# Patient Record
Sex: Male | Born: 1965 | Race: White | Hispanic: No | Smoking: Current every day smoker
Health system: Southern US, Community
[De-identification: ages and names within clinical notes are randomized; demographics above are authoritative.]

## PROBLEM LIST (undated history)

## (undated) VITALS — BP 136/86 | HR 68 | Temp 98.0°F | Resp 18 | Ht 68.0 in | Wt 183.0 lb

## (undated) DIAGNOSIS — S46009A Unspecified injury of muscle(s) and tendon(s) of the rotator cuff of unspecified shoulder, initial encounter: Secondary | ICD-10-CM

## (undated) DIAGNOSIS — F419 Anxiety disorder, unspecified: Secondary | ICD-10-CM

## (undated) DIAGNOSIS — E785 Hyperlipidemia, unspecified: Secondary | ICD-10-CM

## (undated) DIAGNOSIS — R55 Syncope and collapse: Secondary | ICD-10-CM

## (undated) DIAGNOSIS — I10 Essential (primary) hypertension: Secondary | ICD-10-CM

## (undated) HISTORY — PX: HERNIA REPAIR: SHX51

## (undated) HISTORY — PX: MULTIPLE TOOTH EXTRACTIONS: SHX2053

---

## 1998-05-08 ENCOUNTER — Inpatient Hospital Stay (HOSPITAL_COMMUNITY): Admission: AD | Admit: 1998-05-08 | Discharge: 1998-05-13 | Payer: Self-pay | Admitting: Psychiatry

## 1998-05-14 ENCOUNTER — Encounter (HOSPITAL_COMMUNITY): Admission: RE | Admit: 1998-05-14 | Discharge: 1998-08-12 | Payer: Self-pay | Admitting: Internal Medicine

## 1998-05-19 ENCOUNTER — Inpatient Hospital Stay (HOSPITAL_COMMUNITY): Admission: AD | Admit: 1998-05-19 | Discharge: 1998-05-21 | Payer: Self-pay | Admitting: Psychiatry

## 1998-09-11 ENCOUNTER — Inpatient Hospital Stay (HOSPITAL_COMMUNITY): Admission: EM | Admit: 1998-09-11 | Discharge: 1998-09-15 | Payer: Self-pay | Admitting: Emergency Medicine

## 1998-09-22 ENCOUNTER — Emergency Department (HOSPITAL_COMMUNITY): Admission: EM | Admit: 1998-09-22 | Discharge: 1998-09-22 | Payer: Self-pay | Admitting: Emergency Medicine

## 1999-04-01 ENCOUNTER — Emergency Department (HOSPITAL_COMMUNITY): Admission: EM | Admit: 1999-04-01 | Discharge: 1999-04-01 | Payer: Self-pay | Admitting: Emergency Medicine

## 1999-05-25 ENCOUNTER — Emergency Department (HOSPITAL_COMMUNITY): Admission: EM | Admit: 1999-05-25 | Discharge: 1999-05-25 | Payer: Self-pay | Admitting: Emergency Medicine

## 1999-06-25 ENCOUNTER — Emergency Department (HOSPITAL_COMMUNITY): Admission: EM | Admit: 1999-06-25 | Discharge: 1999-06-25 | Payer: Self-pay | Admitting: Emergency Medicine

## 1999-09-26 ENCOUNTER — Emergency Department (HOSPITAL_COMMUNITY): Admission: EM | Admit: 1999-09-26 | Discharge: 1999-09-26 | Payer: Self-pay | Admitting: Emergency Medicine

## 1999-11-09 ENCOUNTER — Emergency Department (HOSPITAL_COMMUNITY): Admission: EM | Admit: 1999-11-09 | Discharge: 1999-11-09 | Payer: Self-pay | Admitting: Emergency Medicine

## 1999-11-22 ENCOUNTER — Emergency Department (HOSPITAL_COMMUNITY): Admission: EM | Admit: 1999-11-22 | Discharge: 1999-11-22 | Payer: Self-pay | Admitting: Emergency Medicine

## 2001-03-20 ENCOUNTER — Encounter: Payer: Self-pay | Admitting: Emergency Medicine

## 2001-03-20 ENCOUNTER — Emergency Department (HOSPITAL_COMMUNITY): Admission: EM | Admit: 2001-03-20 | Discharge: 2001-03-20 | Payer: Self-pay | Admitting: Emergency Medicine

## 2001-05-22 ENCOUNTER — Emergency Department (HOSPITAL_COMMUNITY): Admission: EM | Admit: 2001-05-22 | Discharge: 2001-05-22 | Payer: Self-pay | Admitting: Emergency Medicine

## 2001-08-11 ENCOUNTER — Emergency Department (HOSPITAL_COMMUNITY): Admission: EM | Admit: 2001-08-11 | Discharge: 2001-08-11 | Payer: Self-pay | Admitting: Emergency Medicine

## 2001-08-11 ENCOUNTER — Encounter: Payer: Self-pay | Admitting: Emergency Medicine

## 2001-10-19 ENCOUNTER — Emergency Department (HOSPITAL_COMMUNITY): Admission: EM | Admit: 2001-10-19 | Discharge: 2001-10-19 | Payer: Self-pay | Admitting: Emergency Medicine

## 2002-12-27 ENCOUNTER — Encounter: Payer: Self-pay | Admitting: Family Medicine

## 2002-12-27 ENCOUNTER — Emergency Department (HOSPITAL_COMMUNITY): Admission: EM | Admit: 2002-12-27 | Discharge: 2002-12-27 | Payer: Self-pay | Admitting: Emergency Medicine

## 2003-01-28 ENCOUNTER — Emergency Department (HOSPITAL_COMMUNITY): Admission: EM | Admit: 2003-01-28 | Discharge: 2003-01-28 | Payer: Self-pay | Admitting: *Deleted

## 2003-03-12 ENCOUNTER — Emergency Department (HOSPITAL_COMMUNITY): Admission: EM | Admit: 2003-03-12 | Discharge: 2003-03-12 | Payer: Self-pay | Admitting: Emergency Medicine

## 2004-01-01 ENCOUNTER — Emergency Department (HOSPITAL_COMMUNITY): Admission: EM | Admit: 2004-01-01 | Discharge: 2004-01-01 | Payer: Self-pay | Admitting: Emergency Medicine

## 2004-01-23 ENCOUNTER — Emergency Department (HOSPITAL_COMMUNITY): Admission: EM | Admit: 2004-01-23 | Discharge: 2004-01-23 | Payer: Self-pay | Admitting: Emergency Medicine

## 2004-12-17 ENCOUNTER — Emergency Department (HOSPITAL_COMMUNITY): Admission: EM | Admit: 2004-12-17 | Discharge: 2004-12-18 | Payer: Self-pay | Admitting: *Deleted

## 2005-06-13 ENCOUNTER — Emergency Department (HOSPITAL_COMMUNITY): Admission: EM | Admit: 2005-06-13 | Discharge: 2005-06-13 | Payer: Self-pay | Admitting: Emergency Medicine

## 2006-05-27 ENCOUNTER — Ambulatory Visit (HOSPITAL_COMMUNITY): Admission: RE | Admit: 2006-05-27 | Discharge: 2006-05-27 | Payer: Self-pay | Admitting: Family Medicine

## 2006-05-27 ENCOUNTER — Ambulatory Visit: Payer: Self-pay | Admitting: Family Medicine

## 2006-12-08 DIAGNOSIS — F17200 Nicotine dependence, unspecified, uncomplicated: Secondary | ICD-10-CM | POA: Insufficient documentation

## 2006-12-08 DIAGNOSIS — M1990 Unspecified osteoarthritis, unspecified site: Secondary | ICD-10-CM | POA: Insufficient documentation

## 2010-06-09 ENCOUNTER — Emergency Department (HOSPITAL_COMMUNITY): Admission: EM | Admit: 2010-06-09 | Discharge: 2010-06-09 | Payer: Self-pay | Admitting: Emergency Medicine

## 2010-06-10 ENCOUNTER — Emergency Department (HOSPITAL_COMMUNITY): Admission: EM | Admit: 2010-06-10 | Discharge: 2010-06-10 | Payer: Self-pay | Admitting: Emergency Medicine

## 2011-09-22 ENCOUNTER — Encounter: Payer: Self-pay | Admitting: *Deleted

## 2011-09-22 ENCOUNTER — Emergency Department (HOSPITAL_COMMUNITY)
Admission: EM | Admit: 2011-09-22 | Discharge: 2011-09-23 | Discharge status: Against Medical Advice | Payer: Medicaid Other | Attending: Emergency Medicine | Admitting: Emergency Medicine

## 2011-09-22 DIAGNOSIS — R197 Diarrhea, unspecified: Secondary | ICD-10-CM | POA: Insufficient documentation

## 2011-09-22 DIAGNOSIS — R509 Fever, unspecified: Secondary | ICD-10-CM | POA: Insufficient documentation

## 2011-09-22 NOTE — ED Notes (Signed)
Cannot be found at this time

## 2011-09-22 NOTE — ED Notes (Signed)
Pt reports fever, diarrhea x 3 days. Denies any vomiting or bodyaches. Mask on pt at triage.

## 2012-02-12 ENCOUNTER — Encounter (HOSPITAL_COMMUNITY): Payer: Self-pay

## 2012-02-12 ENCOUNTER — Emergency Department (HOSPITAL_COMMUNITY)
Admission: EM | Admit: 2012-02-12 | Discharge: 2012-02-12 | Disposition: A | Discharge status: Home / Self Care | Payer: Medicaid Other | Attending: Emergency Medicine | Admitting: Emergency Medicine

## 2012-02-12 ENCOUNTER — Emergency Department (HOSPITAL_COMMUNITY): Payer: Medicaid Other

## 2012-02-12 DIAGNOSIS — S4350XA Sprain of unspecified acromioclavicular joint, initial encounter: Secondary | ICD-10-CM | POA: Insufficient documentation

## 2012-02-12 DIAGNOSIS — IMO0001 Reserved for inherently not codable concepts without codable children: Secondary | ICD-10-CM

## 2012-02-12 DIAGNOSIS — X58XXXA Exposure to other specified factors, initial encounter: Secondary | ICD-10-CM | POA: Insufficient documentation

## 2012-02-12 DIAGNOSIS — Z79899 Other long term (current) drug therapy: Secondary | ICD-10-CM | POA: Insufficient documentation

## 2012-02-12 DIAGNOSIS — M25519 Pain in unspecified shoulder: Secondary | ICD-10-CM | POA: Insufficient documentation

## 2012-02-12 MED ORDER — HYDROCODONE-ACETAMINOPHEN 5-325 MG PO TABS: 1.0000 | ORAL_TABLET | Freq: Four times a day (QID) | ORAL | Status: AC | PRN

## 2012-02-12 NOTE — ED Provider Notes (Signed)
Medical screening examination/treatment/procedure(s) were performed by non-physician practitioner and as supervising physician I was immediately available for consultation/collaboration.   Joya Gaskins, MD 02/12/12 [phone]

## 2012-02-12 NOTE — ED Notes (Signed)
Pt reports Right shoulder pain x2 months, pain worse last night.  Has been on meds given by pmd.

## 2012-02-12 NOTE — ED Provider Notes (Signed)
History     CSN: [id number]  Arrival date & time 02/12/12  1104   First MD Initiated Contact with Patient 02/12/12 1121      Chief Complaint  Patient presents with  . Shoulder Pain    (Consider location/radiation/quality/duration/timing/severity/associated sxs/prior treatment) Patient is a 46 y.o. male presenting with shoulder pain. The history is provided by the patient. No language interpreter was used.  Shoulder Pain This is a chronic problem. Episode onset: over 2 months ago. The problem occurs constantly. The problem has been unchanged. Pertinent negatives include no numbness or weakness. Exacerbated by: moving arm. Treatments tried: saw his PCP in [place] who put him an arthritis med ( ? NSAID) with no improvement.  no known injury. The treatment provided no relief.    History reviewed. No pertinent past medical history.  Past Surgical History  Procedure Date  . Hernia repair     No family history on file.  History  Substance Use Topics  . Smoking status: Current Everyday Smoker -- 1.0 packs/day    Types: Cigarettes  . Smokeless tobacco: Not on file  . Alcohol Use: No      Review of Systems  Musculoskeletal:       R AC pain.  Neurological: Negative for weakness and numbness.  All other systems reviewed and are negative.    Allergies  Review of patient's allergies indicates no known allergies.  Home Medications   Current Outpatient Rx  Name Route Sig Dispense Refill  . HYDROCODONE-ACETAMINOPHEN 5-325 MG PO TABS Oral Take 1 tablet by mouth every 6 (six) hours as needed for pain. 20 tablet 0  . PSEUDOEPHEDRINE-APAP-DM 60-1000-30 MG/30ML PO LIQD Oral Take 10 mLs by mouth at bedtime as needed. For cold symptoms       BP 127/66  Pulse 80  Temp(Src) 98 F (36.7 C) (Oral)  Resp 20  Ht 5' 9.5" (1.765 m)  Wt 180 lb (81.647 kg)  BMI 26.20 kg/m2  SpO2 100%  Physical Exam  Nursing note and vitals reviewed. Constitutional: He is oriented to person, place, and  time. He appears well-developed and well-nourished.  HENT:  Head: Normocephalic and atraumatic.  Eyes: EOM are normal.  Neck: Normal range of motion.  Cardiovascular: Normal rate, regular rhythm, normal heart sounds and intact distal pulses.   Pulmonary/Chest: Effort normal and breath sounds normal. No respiratory distress.  Abdominal: Soft. He exhibits no distension. There is no tenderness.  Musculoskeletal: He exhibits tenderness.       Right shoulder: He exhibits decreased range of motion, tenderness, bony tenderness and pain. He exhibits no swelling, no effusion, no crepitus, no deformity and normal pulse.       Pain localized to R AC joint.  Worse with active ROM and with passive distraction downwardly and with placement of R hand on L shoulder.  Neurological: He is alert and oriented to person, place, and time.  Skin: Skin is warm and dry.  Psychiatric: He has a normal mood and affect. Judgment normal.    ED Course  Procedures (including critical care time)  Labs Reviewed - No data to display Dg Ac Joints  02/12/2012  *RADIOLOGY REPORT*  Clinical Data: Right shoulder pain  LEFT ACROMIOCLAVICULAR JOINTS  Comparison: None.  Findings: The AC joints are symmetric and intact bilaterally.  No osseous lesions or fractures are identified.  IMPRESSION: There is no evidence of AC joint dislocation.  Original Report Authenticated By: Brandon Melnick, M.D.     1. Acromioclavicular (joint) (ligament)  sprain and strain       MDM  Exam c/w AC strain.  X-rays are normal.  Sling, ice and f/u with dr. Hilda Lias or Romeo Apple.        Worthy Rancher, PA 02/12/12 1255

## 2012-02-12 NOTE — ED Notes (Signed)
Pt presents with rt shoulder pain that has progressively gotten worse over the past 2 months. Pt reports seeing PCP. Was placed on an " arthritis medicine" per pt without relief. Pt denies trauma/injury. No deformity noted.

## 2012-07-31 ENCOUNTER — Emergency Department (HOSPITAL_COMMUNITY)
Admission: EM | Admit: 2012-07-31 | Discharge: 2012-07-31 | Disposition: A | Discharge status: Home / Self Care | Payer: Medicaid Other | Attending: Emergency Medicine | Admitting: Emergency Medicine

## 2012-07-31 ENCOUNTER — Emergency Department (HOSPITAL_COMMUNITY): Payer: Medicaid Other

## 2012-07-31 ENCOUNTER — Encounter (HOSPITAL_COMMUNITY): Payer: Self-pay | Admitting: Emergency Medicine

## 2012-07-31 DIAGNOSIS — J069 Acute upper respiratory infection, unspecified: Secondary | ICD-10-CM | POA: Insufficient documentation

## 2012-07-31 DIAGNOSIS — R059 Cough, unspecified: Secondary | ICD-10-CM | POA: Insufficient documentation

## 2012-07-31 DIAGNOSIS — F17200 Nicotine dependence, unspecified, uncomplicated: Secondary | ICD-10-CM | POA: Insufficient documentation

## 2012-07-31 DIAGNOSIS — R05 Cough: Secondary | ICD-10-CM | POA: Insufficient documentation

## 2012-07-31 DIAGNOSIS — B9789 Other viral agents as the cause of diseases classified elsewhere: Secondary | ICD-10-CM | POA: Insufficient documentation

## 2012-07-31 DIAGNOSIS — B349 Viral infection, unspecified: Secondary | ICD-10-CM

## 2012-07-31 MED ORDER — ALBUTEROL SULFATE HFA 108 (90 BASE) MCG/ACT IN AERS
2.0000 | INHALATION_SPRAY | Freq: Four times a day (QID) | RESPIRATORY_TRACT | Status: DC | PRN
  Administered 2012-07-31: 2 via RESPIRATORY_TRACT
  Filled 2012-07-31: qty 6.7

## 2012-07-31 NOTE — ED Notes (Signed)
Pt c/o cough/congestion x 5 days. Pt stated the nasal congestion has moved to his chest and he has coughing fits that makes it difficult to breathe. Pt stated he has been coughing up thick,clear mucus.

## 2012-07-31 NOTE — ED Provider Notes (Signed)
History    This chart was scribed for Ward Givens, MD, MD by Smitty Pluck. The patient was seen in room APA11 and the patient's care was started at 9:26AM.   CSN: [id number]  Arrival date & time 07/31/12  5366      Chief Complaint  Patient presents with  . Cough  . Nasal Congestion    (Consider location/radiation/quality/duration/timing/severity/associated sxs/prior treatment) Patient is a 46 y.o. male presenting with cough. The history is provided by the patient. No language interpreter was used.  Cough Associated symptoms include rhinorrhea. Pertinent negatives include no chills.   Aaron [name] is a 46 y.o. male who presents to the Emergency Department complaining of constant, moderate productive cough with clear sputum onset 5 days ago. Pt reports taking Dayquil and Nyquil with improvement. He states he has congestion with clear sputum, sneezing and clear rhinorrhea. He reports that he has SOB during episodes of coughing. He states that he has wheezing with cough, and states he has had to use an inhaler in the past with colds. He denies having fever.  States that he smokes. He has had sick contact with wife (cough).   PCP Bennie Pierini  [facility] in [place]  History reviewed. No pertinent past medical history.  Past Surgical History  Procedure Date  . Hernia repair     History reviewed. No pertinent family history.  History  Substance Use Topics  . Smoking status: Current Every Day Smoker -- 1.0 packs/day    Types: Cigarettes  . Smokeless tobacco: Not on file  . Alcohol Use: No   Employed Lives with spouse   Review of Systems  Constitutional: Negative for fever and chills.  HENT: Positive for congestion, rhinorrhea and sneezing.   Respiratory: Positive for cough.   All other systems reviewed and are negative.    Allergies  Review of patient's allergies indicates no known allergies.  Home Medications   Current Outpatient Rx  Name Route  Sig Dispense Refill  . PSEUDOEPHEDRINE-APAP-DM 60-1000-30 MG/30ML PO LIQD Oral Take 10 mLs by mouth at bedtime as needed. For cold symptoms       BP 127/64  Pulse 82  Temp 98.7 F (37.1 C) (Oral)  Resp 17  Ht 5\' 10"  (1.778 m)  Wt 187 lb (84.823 kg)  BMI 26.83 kg/m2  SpO2 97%  Vital signs normal   Physical Exam  Nursing note and vitals reviewed. Constitutional: He is oriented to person, place, and time. He appears well-developed and well-nourished.  Non-toxic appearance. He does not appear ill. No distress.  HENT:  Head: Normocephalic and atraumatic.  Right Ear: External ear normal.  Left Ear: External ear normal.  Nose: Nose normal. No mucosal edema or rhinorrhea.  Mouth/Throat: Oropharynx is clear and moist and mucous membranes are normal. No dental abscesses or uvula swelling.  Eyes: Conjunctivae normal and EOM are normal. Pupils are equal, round, and reactive to light.  Neck: Normal range of motion and full passive range of motion without pain. Neck supple.  Cardiovascular: Normal rate, regular rhythm and normal heart sounds.  Exam reveals no gallop and no friction rub.   No murmur heard. Pulmonary/Chest: Effort normal. No respiratory distress. He has no rhonchi. He has no rales. He exhibits no tenderness and no crepitus.       Faint expiratory wheeze that cleared with coughing   Abdominal: Soft. Normal appearance and bowel sounds are normal. He exhibits no distension. There is no tenderness. There is no rebound  and no guarding.  Musculoskeletal: Normal range of motion. He exhibits no edema and no tenderness.       Moves all extremities well.   Neurological: He is alert and oriented to person, place, and time. He has normal strength. No cranial nerve deficit.  Skin: Skin is warm, dry and intact. No rash noted. No erythema. No pallor.  Psychiatric: He has a normal mood and affect. His speech is normal and behavior is normal. His mood appears not anxious.    ED Course    Procedures (including critical care time) DIAGNOSTIC STUDIES: Oxygen Saturation is 97% on room air, normal by my interpretation.    COORDINATION OF CARE: 9:48 AM Discussed ED treatment with pt      Dg Chest 2 View  07/31/2012  *RADIOLOGY REPORT*  Clinical Data: Cough, congestion, smoker  CHEST - 2 VIEW  Comparison: None.  Findings: Normal heart size, mediastinal contours, and pulmonary vascularity. Minimal bronchitic changes. No pulmonary infiltrate, pleural effusion, or pneumothorax. Clothing artifacts. Bones unremarkable.  IMPRESSION: Minimal bronchitic changes.   Original Report Authenticated By: Lollie Marrow, M.D.      1. Upper respiratory infection   2. Viral illness    Plan discharge  Devoria Albe, MD, FACEP    MDM  I personally performed the services described in this documentation, which was scribed in my presence. The recorded information has been reviewed and considered.  Devoria Albe, MD, Armando Gang    Ward Givens, MD 07/31/12 [phone]

## 2012-12-31 ENCOUNTER — Ambulatory Visit: Payer: Managed Care, Other (non HMO)

## 2013-02-18 ENCOUNTER — Inpatient Hospital Stay (HOSPITAL_COMMUNITY)
Admission: AD | Admit: 2013-02-18 | Discharge: 2013-02-26 | DRG: 885 | Disposition: A | Discharge status: Home / Self Care | Payer: 59 | Attending: Psychiatry | Admitting: Psychiatry

## 2013-02-18 ENCOUNTER — Encounter (HOSPITAL_COMMUNITY): Payer: Self-pay | Admitting: Registered Nurse

## 2013-02-18 DIAGNOSIS — F339 Major depressive disorder, recurrent, unspecified: Secondary | ICD-10-CM | POA: Diagnosis present

## 2013-02-18 DIAGNOSIS — F332 Major depressive disorder, recurrent severe without psychotic features: Principal | ICD-10-CM | POA: Diagnosis present

## 2013-02-18 DIAGNOSIS — S46009A Unspecified injury of muscle(s) and tendon(s) of the rotator cuff of unspecified shoulder, initial encounter: Secondary | ICD-10-CM

## 2013-02-18 DIAGNOSIS — M1990 Unspecified osteoarthritis, unspecified site: Secondary | ICD-10-CM

## 2013-02-18 DIAGNOSIS — F4310 Post-traumatic stress disorder, unspecified: Secondary | ICD-10-CM | POA: Diagnosis present

## 2013-02-18 DIAGNOSIS — R45851 Suicidal ideations: Secondary | ICD-10-CM

## 2013-02-18 DIAGNOSIS — Z79899 Other long term (current) drug therapy: Secondary | ICD-10-CM

## 2013-02-18 DIAGNOSIS — F411 Generalized anxiety disorder: Secondary | ICD-10-CM | POA: Diagnosis present

## 2013-02-18 DIAGNOSIS — F1010 Alcohol abuse, uncomplicated: Secondary | ICD-10-CM | POA: Diagnosis present

## 2013-02-18 DIAGNOSIS — F17200 Nicotine dependence, unspecified, uncomplicated: Secondary | ICD-10-CM

## 2013-02-18 HISTORY — DX: Unspecified injury of muscle(s) and tendon(s) of the rotator cuff of unspecified shoulder, initial encounter: S46.009A

## 2013-02-18 MED ORDER — NICOTINE 21 MG/24HR TD PT24
21.0000 mg | MEDICATED_PATCH | Freq: Every day | TRANSDERMAL | Status: DC
  Administered 2013-02-18 – 2013-02-26 (×9): 21 mg via TRANSDERMAL
  Filled 2013-02-18 (×13): qty 1

## 2013-02-18 MED ORDER — MAGNESIUM HYDROXIDE 400 MG/5ML PO SUSP: 30.0000 mL | Freq: Every day | ORAL | Status: DC | PRN

## 2013-02-18 MED ORDER — TRAZODONE HCL 50 MG PO TABS
50.0000 mg | ORAL_TABLET | Freq: Every evening | ORAL | Status: DC | PRN
  Administered 2013-02-18: 50 mg via ORAL
  Filled 2013-02-18: qty 1

## 2013-02-18 MED ORDER — ALUM & MAG HYDROXIDE-SIMETH 200-200-20 MG/5ML PO SUSP: 30.0000 mL | ORAL | Status: DC | PRN

## 2013-02-18 MED ORDER — HYDROXYZINE HCL 25 MG PO TABS
25.0000 mg | ORAL_TABLET | Freq: Four times a day (QID) | ORAL | Status: DC | PRN
  Administered 2013-02-18 – 2013-02-26 (×15): 25 mg via ORAL
  Filled 2013-02-18: qty 1
  Filled 2013-02-18: qty 30

## 2013-02-18 MED ORDER — LORAZEPAM 1 MG PO TABS: 1.0000 mg | ORAL_TABLET | Freq: Once | ORAL | Status: AC | PRN

## 2013-02-18 MED ORDER — ACETAMINOPHEN 325 MG PO TABS
650.0000 mg | ORAL_TABLET | Freq: Four times a day (QID) | ORAL | Status: DC | PRN
  Administered 2013-02-25: 650 mg via ORAL
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
## 2013-02-18 NOTE — Tx Team (Signed)
Initial Interdisciplinary Treatment Plan  PATIENT STRENGTHS: (choose at least two) Ability for insight Active sense of humor Average or above average intelligence Capable of independent living Communication skills General fund of knowledge  PATIENT STRESSORS: Loss of spouse Medication change or noncompliance   PROBLEM LIST: Problem List/Patient Goals Date to be addressed Date deferred Reason deferred Estimated date of resolution  Depression                                                       DISCHARGE CRITERIA:  Ability to meet basic life and health needs Improved stabilization in mood, thinking, and/or behavior Motivation to continue treatment in a less acute level of care Need for constant or close observation no longer present Reduction of life-threatening or endangering symptoms to within safe limits Verbal commitment to aftercare and medication compliance  PRELIMINARY DISCHARGE PLAN: Attend aftercare/continuing care group Outpatient therapy Return to previous living arrangement  PATIENT/FAMIILY INVOLVEMENT: This treatment plan has been presented to and reviewed with the patient, Aaron [name], and/or family member, .  The patient and family have been given the opportunity to ask questions and make suggestions.  Andrena Mews 02/18/2013, 5:34 PM
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
## 2013-02-19 ENCOUNTER — Encounter (HOSPITAL_COMMUNITY): Payer: Self-pay | Admitting: Psychiatry

## 2013-02-19 DIAGNOSIS — F332 Major depressive disorder, recurrent severe without psychotic features: Principal | ICD-10-CM

## 2013-02-19 DIAGNOSIS — F1010 Alcohol abuse, uncomplicated: Secondary | ICD-10-CM

## 2013-02-19 DIAGNOSIS — F4310 Post-traumatic stress disorder, unspecified: Secondary | ICD-10-CM | POA: Diagnosis present

## 2013-02-19 DIAGNOSIS — F339 Major depressive disorder, recurrent, unspecified: Secondary | ICD-10-CM | POA: Diagnosis present

## 2013-02-19 LAB — CBC
HCT: 42.9 % (ref 39.0–52.0)
Hemoglobin: 14.9 g/dL (ref 13.0–17.0)
MCHC: 34.7 g/dL (ref 30.0–36.0)
MCV: 92.5 fL (ref 78.0–100.0)
RDW: 12.4 % (ref 11.5–15.5)

## 2013-02-19 LAB — COMPREHENSIVE METABOLIC PANEL
Albumin: 3.8 g/dL (ref 3.5–5.2)
Alkaline Phosphatase: 41 U/L (ref 39–117)
BUN: 8 mg/dL (ref 6–23)
Creatinine, Ser: 0.94 mg/dL (ref 0.50–1.35)
GFR calc Af Amer: >90 mL/min (ref >90)
Glucose, Bld: 95 mg/dL (ref 70–99)
Total Bilirubin: 0.5 mg/dL (ref 0.3–1.2)
Total Protein: 7.2 g/dL (ref 6.0–8.3)

## 2013-02-19 LAB — URINALYSIS, ROUTINE W REFLEX MICROSCOPIC
Hgb urine dipstick: NEGATIVE
Ketones, ur: NEGATIVE mg/dL
Nitrite: NEGATIVE
Protein, ur: NEGATIVE mg/dL
Specific Gravity, Urine: 1.016 (ref 1.005–1.030)

## 2013-02-19 LAB — LIPID PANEL
HDL: 52 mg/dL (ref >39)
LDL Cholesterol: 137 mg/dL — ABNORMAL HIGH (ref 0–99)
Triglycerides: 170 mg/dL — ABNORMAL HIGH (ref <150)

## 2013-02-19 LAB — HEMOGLOBIN A1C
Hgb A1c MFr Bld: 5.2 % (ref <5.7)
Mean Plasma Glucose: 103 mg/dL (ref <117)

## 2013-02-19 LAB — MAGNESIUM: Magnesium: 2.3 mg/dL (ref 1.5–2.5)

## 2013-02-19 LAB — RAPID URINE DRUG SCREEN, HOSP PERFORMED
Barbiturates: NOT DETECTED
Cocaine: NOT DETECTED

## 2013-02-19 LAB — BILIRUBIN, DIRECT: Bilirubin, Direct: <0.1 mg/dL (ref 0.0–0.3)

## 2013-02-19 MED ORDER — GABAPENTIN 100 MG PO CAPS
200.0000 mg | ORAL_CAPSULE | Freq: Three times a day (TID) | ORAL | Status: DC
  Administered 2013-02-19 – 2013-02-21 (×7): 200 mg via ORAL
  Filled 2013-02-19 (×14): qty 2

## 2013-02-19 MED ORDER — ENSURE COMPLETE PO LIQD: 237.0000 mL | Freq: Two times a day (BID) | ORAL | Status: DC | PRN

## 2013-02-19 MED ORDER — ADULT MULTIVITAMIN W/MINERALS CH
1.0000 | ORAL_TABLET | Freq: Every day | ORAL | Status: DC
  Administered 2013-02-19 – 2013-02-26 (×8): 1 via ORAL
  Filled 2013-02-19 (×10): qty 1

## 2013-02-19 MED ORDER — DULOXETINE HCL 30 MG PO CPEP
30.0000 mg | ORAL_CAPSULE | Freq: Every day | ORAL | Status: DC
  Administered 2013-02-19 – 2013-02-21 (×3): 30 mg via ORAL
  Filled 2013-02-19 (×6): qty 1

## 2013-02-19 MED ORDER — TRAZODONE HCL 50 MG PO TABS
150.0000 mg | ORAL_TABLET | Freq: Every evening | ORAL | Status: DC | PRN
  Administered 2013-02-19 – 2013-02-25 (×7): 150 mg via ORAL
  Filled 2013-02-19 (×8): qty 1
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
## 2013-02-19 NOTE — BHH Group Notes (Signed)
[facility] LCSW Aftercare Discharge Planning Group Note   02/19/2013 8:45 AM   Participation Quality: Alert and Appropriate   Mood/Affect: Flat and Depressed  Depression Rating: 9  Anxiety Rating: 6  Thoughts of Suicide: Yes  Will you contract for safety? Yes  Current AVH: No   Plan for Discharge/Comments: Pt attended discharge planning group and actively participated in group. CSW provided pt with today's workbook. Pt states that he came here for depression.  Pt states that he plans to return home, work and take care of his kids.  Pt states that he lives in [place].  No aftercare follow up plans at this time.  Pt states that he doesn't have a psychiatrist/therapist at this time but is open to referrals.  No further needs voiced by pt at this time.    Transportation Means: Pt reports access to transportation   Supports: None reported.   Aaron [name], LCSWA  02/19/2013  9:20 AM

## 2013-02-19 NOTE — Progress Notes (Signed)
D.  Took over Pt's care at 2330.  Pt resting in bed with eyes closed, respirations even and unlabored.  No distress noted.  New admission to unit, see admit note.  A.  Will continue to monitor.  R.  No distress noted, Pt remains safe on unit.

## 2013-02-19 NOTE — Tx Team (Signed)
Interdisciplinary Treatment Plan Update (Adult)  Date: 02/19/2013  Time Reviewed:  9:45 AM  Progress in Treatment: Attending groups: Yes Participating in groups:  Yes Taking medication as prescribed:  Yes Tolerating medication:  Yes Family/Significant othe contact made: No Patient understands diagnosis:  Yes Discussing patient identified problems/goals with staff:  Yes Medical problems stabilized or resolved:  Yes Denies suicidal/homicidal ideation: Yes Issues/concerns per patient self-inventory:  Yes Other:  New problem(s) identified: N/A  Discharge Plan or Barriers: CSW assessing for appropriate follow up for pt.   Reason for Continuation of Hospitalization: Anxiety Depression Medication Stabilization  Comments: N/A  Estimated length of stay: 3-5 days  For review of initial/current patient goals, please see plan of care.  Attendees: Patient:     Family:     Physician:  Dr. Dub Mikes 02/19/2013 1:56 PM   Nursing:   Liborio Nixon, RN 02/19/2013 1:56 PM   Clinical Social Worker:  Reyes Ivan, LCSWA 02/19/2013 1:56 PM   Other: Robbie Louis, RN 02/19/2013 1:57 PM   Other:     Other:     Other:     Other:    Other:    Other:    Other:    Other:    Other:     Scribe for Treatment Team:   Carmina Miller, 02/19/2013 1:57 PM
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
## 2013-02-19 NOTE — Progress Notes (Signed)
Adult Psychoeducational Group Note  Date:  02/19/2013 Time:  11:48 AM  Group Topic/Focus:  Self Care:   The focus of this group is to help patients understand the importance of self-care in order to improve or restore emotional, physical, spiritual, interpersonal, and financial health.  Participation Level:  Did Not Attend   Additional Comments:  Pt. Did not attend group   Aaron [name] 02/19/2013, 11:48 AM 

## 2013-02-19 NOTE — BHH Suicide Risk Assessment (Signed)
Suicide Risk Assessment  Admission Assessment     Nursing information obtained from:    Demographic factors:    Current Mental Status:    Loss Factors:    Historical Factors:    Risk Reduction Factors:     CLINICAL FACTORS:   Depression:   Comorbid alcohol abuse/dependence Insomnia Severe Alcohol/Substance Abuse/Dependencies Previous Psychiatric Diagnoses and Treatments  COGNITIVE FEATURES THAT CONTRIBUTE TO RISK: No evidence   SUICIDE RISK:   Moderate:  Frequent suicidal ideation with limited intensity, and duration, some specificity in terms of plans, no associated intent, good self-control, limited dysphoria/symptomatology, some risk factors present, and identifiable protective factors, including available and accessible social support.  PLAN OF CARE: Supportive approach/coping skills/relapse prevention                              Will start Cymbalta/Neurontin  I certify that inpatient services furnished can reasonably be expected to improve the patient's condition.  Dunya Meiners A 02/19/2013, 3:43 PM

## 2013-02-19 NOTE — Progress Notes (Signed)
Pt reports he is doing better this evening.  He denies any withdrawal symptoms at this time.  He contracts for safety.  He denies HI/AV.  Spent some time talking with pt about his losses in such a short time. Pt is programming on the 500 hall.  Pt voices no needs or concern at this time.  Support and encouragement offered.  Pt plans to return home at discharge.  Safety maintained with q15 minute checks.

## 2013-02-19 NOTE — Progress Notes (Signed)
Recreation Therapy Notes  Date: 05.12.2014 Time: 3:00pm Location: 500 Hall Day Room      Group Topic/Focus: Communication  Participation Level: Active  Participation Quality: Appropriate and Sharing  Affect: Euthymic  Cognitive: Appropriate     Additional Comments: Activity: The Question Web; Explanation: Patients were asked to pass a spool of string around the circle. As each patient passed the spool they stated a number 1 - 20. The person they passed the spool to were then asked to answer a question read by LRT.   Patient participated in activity. Patient answered questions asked of him appropriately. One question required patient state someone they would like to talk to, dead or alive. Patient stated his mother-in law. Patient stated there were no unanswered questions, he just wanted to sit down and have a conversation with her. Patient was asked to leave group at approximately 3:20pm by dietician, patient returned at approximately 3:25. Patient participated in wrap up discussion about using communication to build a support system like the one build by the string used in the activity.   Marykay Lex Raylynne Cubbage, LRT/CTRS  Jihaad Bruschi L 02/19/2013 4:11 PM

## 2013-02-19 NOTE — Progress Notes (Signed)
D: Pt endorses passive SI, per self inventory sheet. Pt reports feeling anxious this morning. Per pt, he always feel anxious. Pt reports feeling depressed 9/10 d/t his recent loss (wife). Pt states he has no support system at this time. Pt presents with flat affect. Depressed mood. A: Medications administered as ordered per MD. Verbal support given. Pt encouraged to attend groups to work on coping with his loss. 15 minute checks performed for safety. R: Pt is anxious/depressed this morning. Pt remains safe on the unit. Pt denies intent to act on SI thoughts. Per pt, that's why he is here, to get help.

## 2013-02-19 NOTE — BHH Group Notes (Signed)
BHH LCSW Group Therapy  02/19/2013 1:15 PM  Type of Therapy:  Group Therapy - Topic was Overcoming Obstacles  Participation Level:  Did Not Attend  [name], Aaron Chirco Nicole 02/19/2013, 1:59 PM   

## 2013-02-19 NOTE — Progress Notes (Signed)
NUTRITION ASSESSMENT  Pt identified as at risk on the Malnutrition Screen Tool  INTERVENTION: 1. Educated patient on the importance of nutrition and encouraged intake of food and beverages. 2. Discussed weight goals. 3. Supplements:  Ensure Complete prn 4.  MVI daily  NUTRITION DIAGNOSIS: Unintentional weight loss related to sub-optimal intake as evidenced by pt report.   Goal: Pt to meet >/= 90% of their estimated nutrition needs.  Monitor:  PO intake  Assessment:  Patient states fair intake currently and prior to admit related to depression.  Wife died in Oct 23, 2022.  UBW 207 lbs in December, decreased to 183 lbs currently.  Used to be very active and was in [employer].  24 lb weight loss in the last 5 months.  47 y.o. male  Height: Ht Readings from Last 1 Encounters:  02/18/13 5\' 8"  (1.727 m)    Weight: Wt Readings from Last 1 Encounters:  02/18/13 183 lb (83.008 kg)    Weight Hx: Wt Readings from Last 10 Encounters:  02/18/13 183 lb (83.008 kg)  07/31/12 187 lb (84.823 kg)  02/12/12 180 lb (81.647 kg)    BMI:  Body mass index is 27.83 kg/(m^2). Pt meets criteria for overweight based on current BMI.  Estimated Nutritional Needs: Kcal: 25-30 kcal/kg Protein: > 1 gram protein/kg Fluid: 1 ml/kcal  Diet Order: General Pt is also offered choice of unit snacks mid-morning and mid-afternoon.  Pt is eating as desired.   Lab results and medications reviewed.   Oran Rein, RD, LDN Clinical Inpatient Dietitian Pager:  [phone] Weekend and after hours pager:  [phone]

## 2013-02-20 DIAGNOSIS — F339 Major depressive disorder, recurrent, unspecified: Secondary | ICD-10-CM

## 2013-02-20 NOTE — Progress Notes (Signed)
D:  Patient up and active in the milieu today.  Has attended groups on the 500 hallway.  Actually transferred to the 500 hallway later today.  He has been pleasant and interacting well with others.  Rates depression at 7 and hopelessness at 4.  Continues to admit to occasional thoughts of suicide, agrees to seek out staff if feeling unsafe.  States sleep is poor and appetite is improving.  A:  Medications given as ordered.  Encouraged group participation to deal with grief issues.  R:  Pleasant and cooperative.  Interacting well with staff and peers.

## 2013-02-20 NOTE — Progress Notes (Signed)
Adult Psychoeducational Group Note  Date:  02/20/2013 Time:  10:00 am  Group Topic/Focus: Recovery Recovery Goals:   The focus of this group is to identify appropriate goals for recovery and establish a plan to achieve them.  Participation Level:  Minimal  Participation Quality:  Appropriate  Affect:  Appropriate  Cognitive:  Alert  Insight: Limited  Engagement in Group:  Developing/Improving  Aaron [name] 02/20/2013, 2:53 PM

## 2013-02-20 NOTE — [facility] Group Notes (Signed)
[facility] LCSW Group Therapy  02/20/2013  1:15 PM   Type of Therapy:  Group Therapy  Participation Level:  Active  Participation Quality:  Appropriate and Attentive  Affect:  Appropriate, Flat and Depressed  Cognitive:  Alert and Appropriate  Insight:  Developing/Improving and Engaged  Engagement in Therapy:  Developing/Improving and Engaged  Modes of Intervention:  Clarification, Confrontation, Discussion, Education, Exploration, Limit-setting, Orientation, Problem-solving, Rapport Building, Dance movement psychotherapist, Socialization and Support  Summary of Progress/Problems: The topic for group therapy was feelings about diagnosis.  Pt actively participated in group discussion on their past and current diagnosis and how they feel towards this.  Pt also identified how society and family members judge them, based on their diagnosis as well as stereotypes and stigmas.  Pt shared that his diagnosis is depression and felt mentally and emotionally out of control.  Pt was able to process his feelings about his diagnosis, stating he feels judged and stereotyped.  Pt shared his fears of family using his admission to the hospital against him.  Pt was able to re frame his negative thoughts surrounding depression to more positive thoughts.    Aaron [name], [place] 02/20/2013 2:41 PM

## 2013-02-20 NOTE — Progress Notes (Signed)
[facility] MD Progress Note  02/20/2013 1:04 PM Aaron [name]  MRN:  [id number] Subjective:  Actively grieving the death of his wife. States that he has not been coping with her death appropriately. States that he never got the chance of slowing down and grief. He can see what he did wrong and he is trying to put a plan together to change things around. As he looks at what he has to do cant help to feel overwhelmed. He has to find a job, move out of where he is staying with his kids, get them so help. Does not feel he has a lot of support out there. Diagnosis:  Major Depression, recurrent, PTSD  ADL's:  Intact  Sleep: Poor  Appetite:  Fair  Suicidal Ideation:  Plan:  denies Intent:  denies Means:  denies Homicidal Ideation:  Plan:  denies Intent:  denies Means:  denies AEB (as evidenced by):  Psychiatric Specialty Exam: Review of Systems  Constitutional: Negative.   HENT: Negative.   Eyes: Negative.   Respiratory: Negative.   Cardiovascular: Negative.   Gastrointestinal: Negative.   Genitourinary: Negative.   Musculoskeletal: Negative.   Skin: Negative.   Neurological: Negative.   Endo/Heme/Allergies: Negative.   Psychiatric/Behavioral: Positive for depression. The patient is nervous/anxious and has insomnia.     Blood pressure 136/67, pulse 83, temperature 98.2 F (36.8 C), temperature source Oral, resp. rate 18, height 5\' 8"  (1.727 m), weight 83.008 kg (183 lb).Body mass index is 27.83 kg/(m^2).  General Appearance: Fairly Groomed  Patent attorney::  Fair  Speech:  Clear and Coherent, Slow and not spontaneous  Volume:  Decreased  Mood:  Depressed  Affect:  Restricted  Thought Process:  Coherent and Goal Directed  Orientation:  Full (Time, Place, and Person)  Thought Content:  worreis, concerns, actively grieving  Suicidal Thoughts:  Not persistant, only fleeting right now  Homicidal Thoughts:  No  Memory:  Immediate;   Fair Recent;   Fair Remote;   Fair  Judgement:  Fair   Insight:  Present  Psychomotor Activity:  Decreased  Concentration:  Fair  Recall:  Fair  Akathisia:  No  Handed:  Right  AIMS (if indicated):     Assets:  Desire for Improvement  Sleep:  Number of Hours: 6.75   Current Medications: Current Facility-Administered Medications  Medication Dose Route Frequency Provider Last Rate Last Dose  . acetaminophen (TYLENOL) tablet 650 mg  650 mg Oral Q6H PRN Shuvon Rankin, NP      . alum & mag hydroxide-simeth (MAALOX/MYLANTA) 200-200-20 MG/5ML suspension 30 mL  30 mL Oral Q4H PRN Shuvon Rankin, NP      . DULoxetine (CYMBALTA) DR capsule 30 mg  30 mg Oral Daily Rachael Fee, MD   30 mg at 02/20/13 0835  . feeding supplement (ENSURE COMPLETE) liquid 237 mL  237 mL Oral BID PRN Jeoffrey Massed, RD      . gabapentin (NEURONTIN) capsule 200 mg  200 mg Oral TID Rachael Fee, MD   200 mg at 02/20/13 1212  . hydrOXYzine (ATARAX/VISTARIL) tablet 25 mg  25 mg Oral Q6H PRN Nanine Means, NP   25 mg at 02/20/13 0836  . magnesium hydroxide (MILK OF MAGNESIA) suspension 30 mL  30 mL Oral Daily PRN Shuvon Rankin, NP      . multivitamin with minerals tablet 1 tablet  1 tablet Oral Daily Jeoffrey Massed, RD   1 tablet at 02/20/13 0835  . nicotine (NICODERM CQ -  dosed in mg/24 hours) patch 21 mg  21 mg Transdermal Daily Nanine Means, NP   21 mg at 02/20/13 0835  . traZODone (DESYREL) tablet 150 mg  150 mg Oral QHS PRN,MR X 1 Rachael Fee, MD   150 mg at 02/19/13 2142    Lab Results:  Results for orders placed during the hospital encounter of 02/18/13 (from the past 48 hour(s))  CBC     Status: None   Collection Time    02/19/13  6:35 AM      Result Value Range   WBC 6.8  4.0 - 10.5 K/uL   RBC 4.64  4.22 - 5.81 MIL/uL   Hemoglobin 14.9  13.0 - 17.0 g/dL   HCT 14.7  82.9 - 56.2 %   MCV 92.5  78.0 - 100.0 fL   MCH 32.1  26.0 - 34.0 pg   MCHC 34.7  30.0 - 36.0 g/dL   RDW 13.0  86.5 - 78.4 %   Platelets 229  150 - 400 K/uL  COMPREHENSIVE METABOLIC PANEL      Status: Abnormal   Collection Time    02/19/13  6:35 AM      Result Value Range   Sodium 139  135 - 145 mEq/L   Potassium 4.2  3.5 - 5.1 mEq/L   Chloride 100  96 - 112 mEq/L   CO2 33 (*) 19 - 32 mEq/L   Glucose, Bld 95  70 - 99 mg/dL   BUN 8  6 - 23 mg/dL   Creatinine, Ser 6.96  0.50 - 1.35 mg/dL   Calcium 9.6  8.4 - 29.5 mg/dL   Total Protein 7.2  6.0 - 8.3 g/dL   Albumin 3.8  3.5 - 5.2 g/dL   AST 21  0 - 37 U/L   ALT 31  0 - 53 U/L   Alkaline Phosphatase 41  39 - 117 U/L   Total Bilirubin 0.5  0.3 - 1.2 mg/dL   GFR calc non Af Amer >90  >90 mL/min   GFR calc Af Amer >90  >90 mL/min   Comment:            The eGFR has been calculated     using the CKD EPI equation.     This calculation has not been     validated in all clinical     situations.     eGFR's persistently     <90 mL/min signify     possible Chronic Kidney Disease.  HEMOGLOBIN A1C     Status: None   Collection Time    02/19/13  6:35 AM      Result Value Range   Hemoglobin A1C 5.2  <5.7 %   Comment: (NOTE)                                                                               According to the ADA Clinical Practice Recommendations for 2011, when     HbA1c is used as a screening test:      >=6.5%   Diagnostic of Diabetes Mellitus               (if abnormal result is confirmed)  5.7-6.4%   Increased risk of developing Diabetes Mellitus     References:Diagnosis and Classification of Diabetes Mellitus,Diabetes     Care,2011,34(Suppl 1):S62-S69 and Standards of Medical Care in             Diabetes - 2011,Diabetes Care,2011,34 (Suppl 1):S11-S61.   Mean Plasma Glucose 103  <117 mg/dL  LIPID PANEL     Status: Abnormal   Collection Time    02/19/13  6:35 AM      Result Value Range   Cholesterol 223 (*) 0 - 200 mg/dL   Triglycerides 782 (*) <150 mg/dL   HDL 52  >95 mg/dL   Total CHOL/HDL Ratio 4.3     VLDL 34  0 - 40 mg/dL   LDL Cholesterol 621 (*) 0 - 99 mg/dL   Comment:            Total  Cholesterol/HDL:CHD Risk     Coronary Heart Disease Risk Table                         Men   Women      1/2 Average Risk   3.4   3.3      Average Risk       5.0   4.4      2 X Average Risk   9.6   7.1      3 X Average Risk  23.4   11.0                Use the calculated Patient Ratio     above and the CHD Risk Table     to determine the patient's CHD Risk.                ATP III CLASSIFICATION (LDL):      <100     mg/dL   Optimal      308-657  mg/dL   Near or Above                        Optimal      130-159  mg/dL   Borderline      846-962  mg/dL   High      >952     mg/dL   Very High  MAGNESIUM     Status: None   Collection Time    02/19/13  6:35 AM      Result Value Range   Magnesium 2.3  1.5 - 2.5 mg/dL  TSH     Status: None   Collection Time    02/19/13  6:35 AM      Result Value Range   TSH 3.621  0.350 - 4.500 uIU/mL  BILIRUBIN, DIRECT     Status: None   Collection Time    02/19/13  6:35 AM      Result Value Range   Bilirubin, Direct <0.1  0.0 - 0.3 mg/dL  URINALYSIS, ROUTINE W REFLEX MICROSCOPIC     Status: None   Collection Time    02/19/13  6:10 PM      Result Value Range   Color, Urine YELLOW  YELLOW   APPearance CLEAR  CLEAR   Specific Gravity, Urine 1.016  1.005 - 1.030   pH 6.0  5.0 - 8.0   Glucose, UA NEGATIVE  NEGATIVE mg/dL   Hgb urine dipstick NEGATIVE  NEGATIVE   Bilirubin Urine NEGATIVE  NEGATIVE   Ketones, ur NEGATIVE  NEGATIVE  mg/dL   Protein, ur NEGATIVE  NEGATIVE mg/dL   Urobilinogen, UA 0.2  0.0 - 1.0 mg/dL   Nitrite NEGATIVE  NEGATIVE   Leukocytes, UA NEGATIVE  NEGATIVE   Comment: MICROSCOPIC NOT DONE ON URINES WITH NEGATIVE PROTEIN, BLOOD, LEUKOCYTES, NITRITE, OR GLUCOSE <1000 mg/dL.  URINE RAPID DRUG SCREEN (HOSP PERFORMED)     Status: Abnormal   Collection Time    02/19/13  6:10 PM      Result Value Range   Opiates NONE DETECTED  NONE DETECTED   Cocaine NONE DETECTED  NONE DETECTED   Benzodiazepines NONE DETECTED  NONE DETECTED    Amphetamines NONE DETECTED  NONE DETECTED   Tetrahydrocannabinol POSITIVE (*) NONE DETECTED   Barbiturates NONE DETECTED  NONE DETECTED   Comment:            DRUG SCREEN FOR MEDICAL PURPOSES     ONLY.  IF CONFIRMATION IS NEEDED     FOR ANY PURPOSE, NOTIFY LAB     WITHIN 5 DAYS.                LOWEST DETECTABLE LIMITS     FOR URINE DRUG SCREEN     Drug Class       Cutoff (ng/mL)     Amphetamine      1000     Barbiturate      200     Benzodiazepine   200     Tricyclics       300     Opiates          300     Cocaine          300     THC              50    Physical Findings: AIMS: Facial and Oral Movements Muscles of Facial Expression: None, normal Lips and Perioral Area: None, normal Jaw: None, normal Tongue: None, normal,Extremity Movements Upper (arms, wrists, hands, fingers): None, normal Lower (legs, knees, ankles, toes): None, normal, Trunk Movements Neck, shoulders, hips: None, normal, Overall Severity Severity of abnormal movements (highest score from questions above): None, normal Incapacitation due to abnormal movements: None, normal Patient's awareness of abnormal movements (rate only patient's report): No Awareness, Dental Status Current problems with teeth and/or dentures?: No Does patient usually wear dentures?: No  CIWA:  CIWA-Ar Total: 1 COWS:     Treatment Plan Summary: Daily contact with patient to assess and evaluate symptoms and progress in treatment Medication management  Plan: Supportive approach/coping skills/relapse prevention           Will work on grief/loss issues           Chaplain to follow up           Will work to increase the Cymbalta to full therapeutic dose tomorrow and then will request two more days after tomorrow to observe for any possible side effects, and to further work on his grief  Medical Decision Making Problem Points:  Review of last therapy session (1) and Review of psycho-social stressors (1) Data Points:  Review of  medication regiment & side effects (2)  I certify that inpatient services furnished can reasonably be expected to improve the patient's condition.   Jaloni Davoli A 02/20/2013, 1:04 PM

## 2013-02-20 NOTE — [facility] Group Notes (Signed)
[facility] LCSW Aftercare Discharge Planning Group Note   02/20/2013 8:45 AM  Participation Quality:  Alert and Appropriate   Mood/Affect:  Appropriate  Depression Rating:  7  Anxiety Rating:  7  Thoughts of Suicide:  No  Will you contract for safety?   N/A  Current AVH:  No  Plan for Discharge/Comments:  Pt attended discharge planning group and actively participated in group.  CSW provided pt with today's workbook.  Pt presents with calm mood and affect.  Pt states that he is doing better today due to having a good group yesterday, which encouraged him to establish more supports.  Pt states that he plans to work on his relationship with his brother and sister in laws.  Pt will follow up with Arna Medici for medication management and therapy.  No further needs voiced by pt at this time.     Transportation Means: Yes  Supports: Brother and Sister in [place], [place] 02/20/2013 10:05 AM

## 2013-02-20 NOTE — Progress Notes (Signed)
Patient went to dining room for dinner.  Interacting positively with peers.  Denied pain.  Patient has been cooperative and pleasant.

## 2013-02-21 DIAGNOSIS — F411 Generalized anxiety disorder: Secondary | ICD-10-CM

## 2013-02-21 MED ORDER — GABAPENTIN 300 MG PO CAPS
300.0000 mg | ORAL_CAPSULE | Freq: Three times a day (TID) | ORAL | Status: DC
  Administered 2013-02-21 – 2013-02-26 (×15): 300 mg via ORAL
  Filled 2013-02-21: qty 1
  Filled 2013-02-21: qty 42
  Filled 2013-02-21 (×2): qty 1
  Filled 2013-02-21: qty 42
  Filled 2013-02-21 (×4): qty 1
  Filled 2013-02-21: qty 42
  Filled 2013-02-21 (×11): qty 1

## 2013-02-21 MED ORDER — DULOXETINE HCL 20 MG PO CPEP
40.0000 mg | ORAL_CAPSULE | Freq: Every day | ORAL | Status: DC
Start: 2013-02-22 — End: 2013-02-22
  Administered 2013-02-22: 40 mg via ORAL
  Filled 2013-02-21 (×3): qty 2

## 2013-02-21 NOTE — Progress Notes (Signed)
[facility] MD Progress Note  02/21/2013 3:04 PM Aaron [name]  MRN:  [id number] Subjective:  Experiencing some anxiety attacks. Having a hard time opening up and talking about his issues. He states that he feels unsafe as far as being ready to go home and deal with his kids who need so much help. States that he knows the medications are going to take a little longer to work, but he would like to be feeling better. Diagnosis:  Major Depression recurrent, PTSD, Anxiety Disorder NOS  ADL's:  Intact  Sleep: Fair  Appetite:  Fair  Suicidal Ideation:  Plan:  denies Intent:  denies Means:  denies Homicidal Ideation:  Plan:  denies Intent:  denies Means:  denies AEB (as evidenced by):  Psychiatric Specialty Exam: Review of Systems  Constitutional: Negative.   HENT: Negative.   Eyes: Negative.   Respiratory: Negative.   Cardiovascular: Negative.   Gastrointestinal: Negative.   Genitourinary: Negative.   Musculoskeletal: Negative.   Skin: Negative.   Neurological: Negative.   Endo/Heme/Allergies: Negative.   Psychiatric/Behavioral: Positive for depression. The patient is nervous/anxious.     Blood pressure 123/83, pulse 71, temperature 98.1 F (36.7 C), temperature source Oral, resp. rate 16, height 5\' 8"  (1.727 m), weight 83.008 kg (183 lb).Body mass index is 27.83 kg/(m^2).  General Appearance: Fairly Groomed  Patent attorney::  Fair  Speech:  Clear and Coherent and Slow  Volume:  Decreased  Mood:  Anxious and Depressed  Affect:  anxious, worried  Thought Process:  Coherent and Goal Directed  Orientation:  Full (Time, Place, and Person)  Thought Content:  worries, concerns  Suicidal Thoughts:  Intermittent  Homicidal Thoughts:  No  Memory:  Immediate;   Fair Recent;   Fair Remote;   Fair  Judgement:  Fair  Insight:  Present  Psychomotor Activity:  Restlessness  Concentration:  Fair  Recall:  Fair  Akathisia:  No  Handed:  Right  AIMS (if indicated):     Assets:  Desire for  Improvement  Sleep:  Number of Hours: 6.25   Current Medications: Current Facility-Administered Medications  Medication Dose Route Frequency Provider Last Rate Last Dose  . acetaminophen (TYLENOL) tablet 650 mg  650 mg Oral Q6H PRN Shuvon Rankin, NP      . alum & mag hydroxide-simeth (MAALOX/MYLANTA) 200-200-20 MG/5ML suspension 30 mL  30 mL Oral Q4H PRN Shuvon Rankin, NP      . [START ON 02/22/2013] DULoxetine (CYMBALTA) DR capsule 40 mg  40 mg Oral Daily Rachael Fee, MD      . feeding supplement (ENSURE COMPLETE) liquid 237 mL  237 mL Oral BID PRN Jeoffrey Massed, RD      . gabapentin (NEURONTIN) capsule 300 mg  300 mg Oral TID Rachael Fee, MD      . hydrOXYzine (ATARAX/VISTARIL) tablet 25 mg  25 mg Oral Q6H PRN Nanine Means, NP   25 mg at 02/21/13 1107  . magnesium hydroxide (MILK OF MAGNESIA) suspension 30 mL  30 mL Oral Daily PRN Shuvon Rankin, NP      . multivitamin with minerals tablet 1 tablet  1 tablet Oral Daily Jeoffrey Massed, RD   1 tablet at 02/21/13 0803  . nicotine (NICODERM CQ - dosed in mg/24 hours) patch 21 mg  21 mg Transdermal Daily Nanine Means, NP   21 mg at 02/21/13 0803  . traZODone (DESYREL) tablet 150 mg  150 mg Oral QHS PRN,MR X 1 Rachael Fee, MD  150 mg at 02/20/13 2128    Lab Results:  Results for orders placed during the hospital encounter of 02/18/13 (from the past 48 hour(s))  URINALYSIS, ROUTINE W REFLEX MICROSCOPIC     Status: None   Collection Time    02/19/13  6:10 PM      Result Value Range   Color, Urine YELLOW  YELLOW   APPearance CLEAR  CLEAR   Specific Gravity, Urine 1.016  1.005 - 1.030   pH 6.0  5.0 - 8.0   Glucose, UA NEGATIVE  NEGATIVE mg/dL   Hgb urine dipstick NEGATIVE  NEGATIVE   Bilirubin Urine NEGATIVE  NEGATIVE   Ketones, ur NEGATIVE  NEGATIVE mg/dL   Protein, ur NEGATIVE  NEGATIVE mg/dL   Urobilinogen, UA 0.2  0.0 - 1.0 mg/dL   Nitrite NEGATIVE  NEGATIVE   Leukocytes, UA NEGATIVE  NEGATIVE   Comment: MICROSCOPIC NOT DONE ON  URINES WITH NEGATIVE PROTEIN, BLOOD, LEUKOCYTES, NITRITE, OR GLUCOSE <1000 mg/dL.  URINE RAPID DRUG SCREEN (HOSP PERFORMED)     Status: Abnormal   Collection Time    02/19/13  6:10 PM      Result Value Range   Opiates NONE DETECTED  NONE DETECTED   Cocaine NONE DETECTED  NONE DETECTED   Benzodiazepines NONE DETECTED  NONE DETECTED   Amphetamines NONE DETECTED  NONE DETECTED   Tetrahydrocannabinol POSITIVE (*) NONE DETECTED   Barbiturates NONE DETECTED  NONE DETECTED   Comment:            DRUG SCREEN FOR MEDICAL PURPOSES     ONLY.  IF CONFIRMATION IS NEEDED     FOR ANY PURPOSE, NOTIFY LAB     WITHIN 5 DAYS.                LOWEST DETECTABLE LIMITS     FOR URINE DRUG SCREEN     Drug Class       Cutoff (ng/mL)     Amphetamine      1000     Barbiturate      200     Benzodiazepine   200     Tricyclics       300     Opiates          300     Cocaine          300     THC              50    Physical Findings: AIMS: Facial and Oral Movements Muscles of Facial Expression: None, normal Lips and Perioral Area: None, normal Jaw: None, normal Tongue: None, normal,Extremity Movements Upper (arms, wrists, hands, fingers): None, normal Lower (legs, knees, ankles, toes): None, normal, Trunk Movements Neck, shoulders, hips: None, normal, Overall Severity Severity of abnormal movements (highest score from questions above): None, normal Incapacitation due to abnormal movements: None, normal Patient's awareness of abnormal movements (rate only patient's report): No Awareness, Dental Status Current problems with teeth and/or dentures?: No Does patient usually wear dentures?: No  CIWA:  CIWA-Ar Total: 1 COWS:     Treatment Plan Summary: Daily contact with patient to assess and evaluate symptoms and progress in treatment Medication management  Plan: Supportive approach/coping skills/grief-loss           Increase the Cymbalta to 40 mg in AM           Neurontin 300 mg TID  Medical Decision  Making Problem Points:  Review of psycho-social stressors (1) Data  Points:  Review of medication regiment & side effects (2)  I certify that inpatient services furnished can reasonably be expected to improve the patient's condition.   Tevan Marian A 02/21/2013, 3:04 PM

## 2013-02-21 NOTE — Progress Notes (Signed)
Adult Psychoeducational Group Note  Date:  02/21/2013 Time:  11:23 PM  Group Topic/Focus:  Goals Group:   The focus of this group is to help patients establish daily goals to achieve during treatment and discuss how the patient can incorporate goal setting into their daily lives to aide in recovery.  Participation Level:  Active  Participation Quality:  Attentive  Affect:  Appropriate  Cognitive:  Appropriate  Insight: Good  Engagement in Group:  Engaged  Modes of Intervention:  Support  Additional Comments:  Pt. Didn't want to share what was on his mind.  Terie Purser R 02/21/2013, 11:23 PM

## 2013-02-21 NOTE — Tx Team (Signed)
Interdisciplinary Treatment Plan Update (Adult)  Date: 02/21/2013   Time Reviewed:  9:45 AM  Progress in Treatment: Attending groups: Yes Participating in groups:  Yes Taking medication as prescribed:  Yes Tolerating medication:  Yes Family/Significant othe contact made: Pt refused Patient understands diagnosis:  Yes Discussing patient identified problems/goals with staff:  Yes Medical problems stabilized or resolved:  Yes Denies suicidal/homicidal ideation: Yes Issues/concerns per patient self-inventory:  Yes Other:  New problem(s) identified: N/A  Discharge Plan or Barriers: Pt will follow up with Arna Medici for medication management and therapy.   Reason for Continuation of Hospitalization: Anxiety Depression Medication Stabilization  Comments: N/A  Estimated length of stay: 3-5 days  For review of initial/current patient goals, please see plan of care.  Attendees: Patient:     Family:     Physician:   02/21/2013 8:08 AM   Nursing:   Nestor Ramp, RN 02/21/2013 8:08 AM   Clinical Social Worker:  Reyes Ivan, LCSWA 02/21/2013 8:08 AM   Other: Orlene Erm, LCSW 02/21/2013 8:08 AM   Other:  Berneice Heinrich, RN 02/21/2013 10:14 AM   Other:  Fransisca Kaufmann, NP 02/21/2013 10:14 AM   Other:  Lizabeth Leyden, RN 02/21/2013 10:14 AM   Other:    Other:    Other:    Other:    Other:    Other:       Scribe for Treatment Team:   Carmina Miller, 02/21/2013 10:15 AM

## 2013-02-21 NOTE — Progress Notes (Signed)
Recreation Therapy Notes  Date: 05.14.2014 Time: 3:00pm Location: [address] Dayroom      Group Topic/Focus: Decision Making  Participation Level: Did not attend  Gwyndolyn Guilford L Bensen [place], LRT/CTRS  Tyrian Peart L 02/21/2013 4:24 PM 

## 2013-02-21 NOTE — [facility] Group Notes (Signed)
[facility] LCSW Group Therapy  02/21/2013  1:15 PM   Type of Therapy:  Group Therapy  Participation Level:  Active  Participation Quality:  Appropriate and Attentive  Affect:  Appropriate, Depressed and Flat  Cognitive:  Alert and Appropriate  Insight:  Developing/Improving and Engaged  Engagement in Therapy:  Developing/Improving and Engaged  Modes of Intervention:  Clarification, Confrontation, Discussion, Education, Exploration, Limit-setting, Orientation, Problem-solving, Rapport Building, Dance movement psychotherapist, Socialization and Support  Summary of Progress/Problems: The topic for group today was emotional regulation.  Pt participated in the discussion regarding what emotional regulation is and how it affects their life, positive and negative.  Pt discussed coping skills and ways they can regulate their emotions in a positive manner.   Pt was an active participant in group discussion today.  Pt shared what brought him to the hospital, sharing the recent losses and how he was unable to hold it together any longer, resulting in coming here.  Pt was able to process feelings of guilt and shame for having to come here to get help through this difficult time, but was able to motivate others to stay positive and that things will eventually get better.  Pt shared that he overcame depression 14 years ago and feels hopeful he will be able to overcome it again.    Aaron [name], [place] 02/21/2013 2:46 PM

## 2013-02-21 NOTE — Progress Notes (Signed)
  D) Patient quiet but cooperative upon my assessment. Patient verbalizes "I am feeling better today." Patient completed Patient Self Inventory, reports slept "well," and  appetite is "good." Patient rates depression as  6 /10, patient rates hopeless feelings as 2 /10. Patient denies SI/HI, denies A/V hallucinations.   A) Patient offered support and encouragement, patient encouraged to discuss feelings/concerns with staff. Patient verbalized understanding. Patient monitored Q15 minutes for safety. Patient met with MD  to discuss today's goals and plan of care.  R) Patient visible in milieu, attending groups in day room and meals in dining room. Patient appropriate with staff and peers.   Patient taking medications as ordered. Patient has a plan to change "a lot! Need to change all my negative habits." Will continue to monitor.

## 2013-02-21 NOTE — [facility] Suicide Risk Assessment (Signed)
[facility] Adult Inpatient Family/Significant Other Suicide Prevention Education  Suicide Prevention Education:   Patient Refusal for Family/Significant Other Suicide Prevention Education: The patient has refused to provide written consent for family/significant other to be provided Family/Significant Other Suicide Prevention Education during admission and/or prior to discharge.  Physician notified.  CSW provided suicide prevention information with patient.    The suicide prevention education provided includes the following:  Suicide risk factors  Suicide prevention and interventions  National Suicide Hotline telephone number  [facility] assessment telephone number  [facility] Emergency Assistance 911  [facility] and/or Residential Mobile Crisis Unit telephone number   Aaron [name], [place] 02/21/2013 9:19 AM

## 2013-02-21 NOTE — [facility] Group Notes (Signed)
[facility] LCSW Aftercare Discharge Planning Group Note   02/21/2013 8:45 AM  Participation Quality:  Alert and Appropriate   Mood/Affect:  Appropriate, Flat and Depressed  Depression Rating:  6  Anxiety Rating:  7-8  Thoughts of Suicide:  Pt denies SI/HI  Will you contract for safety?   Yes  Current AVH:  No  Plan for Discharge/Comments:  Pt attended discharge planning group and actively participated in group.  CSW provided pt with today's workbook.  Pt states that he is feeling better today.  Pt is aware of d/c plans, stating he will follow up at [facility] for medication management and therapy.  Pt was also provided information on [facility] for grief/loss counseling and [facility] for therapy for his children.  No further needs voiced by pt at this time.    Transportation Means: Yes  Supports: Family is supportive  Aaron [name], LCSWA 02/21/2013 10:22 AM

## 2013-02-21 NOTE — Progress Notes (Signed)
Adult Psychoeducational Group Note  Date:  02/21/2013 Time:  11:00AM Group Topic/Focus: Mental Health Crisis Coping With Mental Health Crisis:   The purpose of this group is to help patients identify strategies for coping with mental health crisis.  Group discusses possible causes of crisis and ways to manage them effectively.  Participation Level:  Minimal  Participation Quality:  Appropriate and Attentive  Affect:  Appropriate  Cognitive:  Appropriate  Insight: Appropriate  Engagement in Group:  Improving   Additional Comments:  Patient stayed for most of the group but did leave near the end.   Dwain Sarna P 02/21/2013, 12:13 PM

## 2013-02-22 MED ORDER — DULOXETINE HCL 60 MG PO CPEP
60.0000 mg | ORAL_CAPSULE | Freq: Every day | ORAL | Status: DC
  Administered 2013-02-23 – 2013-02-26 (×4): 60 mg via ORAL
  Filled 2013-02-22 (×2): qty 1
  Filled 2013-02-22: qty 14
  Filled 2013-02-22 (×4): qty 1

## 2013-02-22 NOTE — Progress Notes (Signed)
Adult Psychoeducational Group Note  Date:  02/22/2013 Time:  9:43 PM  Group Topic/Focus:  Rediscovering Joy:   The focus of this group is to explore various ways to relieve stress in a positive manner.  Participation Level:  Active  Participation Quality:  Appropriate  Affect:  Appropriate  Cognitive:  Appropriate  Insight: Appropriate  Engagement in Group:  Supportive  Modes of Intervention:  Role-play  Additional Comments:    Adelina Mings 02/22/2013, 9:43 PM

## 2013-02-22 NOTE — [facility] Group Notes (Signed)
[facility] LCSW Group Therapy  02/22/2013  1:15 PM   Type of Therapy:  Group Therapy  Participation Level:  Active  Participation Quality:  Appropriate and Attentive  Affect:  Appropriate  Cognitive:  Alert and Appropriate  Insight:  Developing/Improving and Engaged  Engagement in Therapy:  Developing/Improving and Engaged  Modes of Intervention:  Clarification, Confrontation, Discussion, Education, Exploration, Limit-setting, Orientation, Problem-solving, Rapport Building, Socialization and Support  Summary of Progress/Problems: Patient shared he has to learn how to accept all the responsibility he has now that his wife is no longer there.  He shared he is agreeable to grief counseling and also that his children would benefit from grief counseling as well.  Patient shared concerns that son who had been an A/B student had C's on his last report card.  Patient encouraged to talk with hospital about programs they have for children and teens.   Aaron [name], LCSWA 02/22/2013 11:50 AM

## 2013-02-22 NOTE — Progress Notes (Signed)
D - Pt up and active on the unit today, interacting with peers appropriately. Pt continues to endorse passive SI, pt denies HI and auditory or visual hallucinations. Pt rates his depression 8/10 and his hopelessness 7/10 per his self inventory sheet. Pt reports that his energy level is low but his appetite is improving. Pt briefly attended Psychoeducational group this am with only minimal participation. Pt reports one of his stressors in being in a large group of people that are all talking at the same time. Pt did not remain in group to discuss ways of dealing with his stress.  A - Pt offered encouragement and support through therapeutic conversation. Encouraged pt to speak with staff about any concerns or questions. Medications given as ordered.  R - Pt safety maintained with Q 15 minute checks. Pt remains safe on the unit.

## 2013-02-22 NOTE — [facility] Group Notes (Signed)
[facility] LCSW Aftercare Discharge Planning Group Note   02/22/2013 8:45 AM  Participation Quality:  Alert and Appropriate   Mood/Affect:  Appropriate  Depression Rating: 5    Anxiety Rating:  7  Thoughts of Suicide:  Pt denies SI/HI  Will you contract for safety?   Yes  Current AVH:  No  Plan for Discharge/Comments:  Pt attended discharge planning group and actively participated in group.  CSW provided pt with today's workbook.  Pt states that he is doing well today.  Pt shared that his kids are doing well at home.  Pt will follow up with [facility] for medication management and therapy.  No further needs voiced by pt at this time.    Transportation Means: Yes  Supports: Family is supportive  Designer, fashion/clothing, LCSWA

## 2013-02-22 NOTE — Progress Notes (Signed)
Adult Psychoeducational Group Note  Date:  02/22/2013 Time:  12:35 PM  Group Topic/Focus: Leisure and lifestyle changes Self Care:   The focus of this group is to help patients understand the importance of self-care in order to improve or restore emotional, physical, spiritual, interpersonal, and financial health.  Participation Level:  Minimal  Participation Quality:  Appropriate  Affect:  Appropriate  Cognitive:  Appropriate  Insight: Appropriate  Engagement in Group:  Limited  Modes of Intervention:  Support  Additional Comments:  Pt attended group for 5 - 10 minutes then left group, pt returned at the end of group. Very minimal participation.   Sandria Senter 02/22/2013, 12:35 PM

## 2013-02-22 NOTE — Progress Notes (Signed)
[facility] MD Progress Note  02/22/2013 5:36 PM Aaron [name]  MRN:  [id number] Subjective:  Khiyan states that he is starting to have a better sense of a future. He has communicated with his children and they are all understanding and supportive. He can identify different sources of support he can access. He states that he was helped by meeting with the chaplain States that he was very angry with God, but that now he feels he is more at peace and ready to go back to church and use church as a source of support Diagnosis:  Major Depression, recurrent, PTSD  ADL's:  Intact  Sleep: Fair  Appetite:  Fair  Suicidal Ideation:  Plan:  denies Intent:  denies Means:  denies Homicidal Ideation:  Plan:  denies Intent:  denies Means:  denies AEB (as evidenced by):  Psychiatric Specialty Exam: Review of Systems  Constitutional: Negative.   HENT: Negative.   Eyes: Negative.   Respiratory: Negative.   Cardiovascular: Negative.   Gastrointestinal: Negative.   Genitourinary: Negative.   Musculoskeletal: Positive for joint pain.  Skin: Negative.   Neurological: Negative.   Endo/Heme/Allergies: Negative.   Psychiatric/Behavioral: Positive for depression and substance abuse. The patient is nervous/anxious.     Blood pressure 110/73, pulse 75, temperature 97.7 F (36.5 C), temperature source Oral, resp. rate 18, height 5\' 8"  (1.727 m), weight 83.008 kg (183 lb).Body mass index is 27.83 kg/(m^2).  General Appearance: Fairly Groomed  Patent attorney::  Fair  Speech:  Clear and Coherent  Volume:  Decreased  Mood:  sad  Affect:  Appropriate  Thought Process:  Coherent and Goal Directed  Orientation:  Full (Time, Place, and Person)  Thought Content:  worries, concerns  Suicidal Thoughts:  No  Homicidal Thoughts:  No  Memory:  Immediate;   Fair Recent;   Fair Remote;   Fair  Judgement:  Fair  Insight:  Present  Psychomotor Activity:  Normal  Concentration:  Fair  Recall:  Fair  Akathisia:  No   Handed:  Right  AIMS (if indicated):     Assets:  Desire for Improvement Social Support  Sleep:  Number of Hours: 6.75   Current Medications: Current Facility-Administered Medications  Medication Dose Route Frequency Provider Last Rate Last Dose  . acetaminophen (TYLENOL) tablet 650 mg  650 mg Oral Q6H PRN Shuvon Rankin, NP      . alum & mag hydroxide-simeth (MAALOX/MYLANTA) 200-200-20 MG/5ML suspension 30 mL  30 mL Oral Q4H PRN Shuvon Rankin, NP      . DULoxetine (CYMBALTA) DR capsule 40 mg  40 mg Oral Daily Rachael Fee, MD   40 mg at 02/22/13 0744  . feeding supplement (ENSURE COMPLETE) liquid 237 mL  237 mL Oral BID PRN Jeoffrey Massed, RD      . gabapentin (NEURONTIN) capsule 300 mg  300 mg Oral TID Rachael Fee, MD   300 mg at 02/22/13 1719  . hydrOXYzine (ATARAX/VISTARIL) tablet 25 mg  25 mg Oral Q6H PRN Nanine Means, NP   25 mg at 02/22/13 1720  . magnesium hydroxide (MILK OF MAGNESIA) suspension 30 mL  30 mL Oral Daily PRN Shuvon Rankin, NP      . multivitamin with minerals tablet 1 tablet  1 tablet Oral Daily Jeoffrey Massed, RD   1 tablet at 02/22/13 0744  . nicotine (NICODERM CQ - dosed in mg/24 hours) patch 21 mg  21 mg Transdermal Daily Nanine Means, NP   21 mg at 02/22/13  57  . traZODone (DESYREL) tablet 150 mg  150 mg Oral QHS PRN,MR X 1 Rachael Fee, MD   150 mg at 02/21/13 2129    Lab Results: No results found for this or any previous visit (from the past 48 hour(s)).  Physical Findings: AIMS: Facial and Oral Movements Muscles of Facial Expression: None, normal Lips and Perioral Area: None, normal Jaw: None, normal Tongue: None, normal,Extremity Movements Upper (arms, wrists, hands, fingers): None, normal Lower (legs, knees, ankles, toes): None, normal, Trunk Movements Neck, shoulders, hips: None, normal, Overall Severity Severity of abnormal movements (highest score from questions above): None, normal Incapacitation due to abnormal movements: None,  normal Patient's awareness of abnormal movements (rate only patient's report): No Awareness, Dental Status Current problems with teeth and/or dentures?: No Does patient usually wear dentures?: No  CIWA:  CIWA-Ar Total: 1 COWS:     Treatment Plan Summary: Daily contact with patient to assess and evaluate symptoms and progress in treatment Medication management  Plan: Supportive approach/coping skills/relapse prevention           Increase the Cymbalta to 60 mg  Medical Decision Making Problem Points:  Review of last therapy session (1) and Review of psycho-social stressors (1) Data Points:  Review of medication regiment & side effects (2) Review of new medications or change in dosage (2)  I certify that inpatient services furnished can reasonably be expected to improve the patient's condition.   Marymargaret Kirker A 02/22/2013, 5:36 PM

## 2013-02-22 NOTE — Progress Notes (Signed)
Provided emotional and spiritual support with pt around extensive grief, anger, uncertainty around faith.

## 2013-02-22 NOTE — Progress Notes (Signed)
Patient ID: Aaron [name], male   DOB: 07/08/1966, 47 y.o.   MRN: [id number] D: Patient denies SI/HI/AVH. Pt mood/affect is anxious. Pt stated he has been anxious all day. Pt refused to discuss with writer cause of anxiety. Pt attended evening wrap up group and Interacted appropriately with peers. Pt denies any needs or concerns.  Cooperative with assessment. No acute distressed noted at this time.   A: Met with pt 1:1. Medications administered as prescribed. Writer encouraged pt to discuss feelings. Pt encouraged to come to staff with any question or concerns. 15 minutes checks for safety.  R: Patient remains safe. He is complaint with medications and denies any adverse reaction. Continue current POC.

## 2013-02-22 NOTE — Progress Notes (Signed)
Patient ID: Aaron [name], male   DOB: 04-May-1966, 47 y.o.   MRN: [id number] Denied SI and HI.  Denied A/V hallucinations.  Stated he is not ready to go home.

## 2013-02-22 NOTE — Tx Team (Signed)
Interdisciplinary Treatment Plan Update (Adult)  Date: 02/23/13 Time Reviewed:  9:45 AM  Progress in Treatment: Attending groups: Yes Participating in groups:  Yes Taking medication as prescribed:  Yes Tolerating medication:  Yes Family/Significant othe contact made: Pt refused consent Patient understands diagnosis:  Yes Discussing patient identified problems/goals with staff:  Yes Medical problems stabilized or resolved:  Yes Denies suicidal/homicidal ideation: Yes Issues/concerns per patient self-inventory:  Yes Other:  New problem(s) identified: N/A  Discharge Plan or Barriers: Pt has follow up scheduled with [facility] for medication management and therapy.    Reason for Continuation of Hospitalization:  Depression Anxiety Medication Stabilization  Comments: N/A  Estimated length of stay: 3 days  For review of initial/current patient goals, please see plan of care.  Attendees: Patient:     Family:     Physician:  Dr. Daleen Bo 02/23/13 10:00 am  Nursing:    02/23/13 10:00 am  Clinical Social Worker:  Juline Patch, LCSW  02/23/13 10:00 am  Other:  02/23/13 10:00 am  Other:     Other:     Other:     Other:    Other:    Other:    Other:    Other:    Other:     Scribe for Treatment Team:   Carmina Miller, 02/23/13 10:41 AM

## 2013-02-22 NOTE — Progress Notes (Signed)
Patient ID: Aaron [name], male   DOB: 1966-03-19, 47 y.o.   MRN: [id number] D: Pt. Reports "in good spirits today". "trying to get use to my medications" "having issues with panic attacks but okay today" Pt. Says he was inpatient 14 year ago, but this time scared him. Pt. Says "sometimes I'm up sometimes I'm down, but right now I'm okay". A: Writer introduced self to client and encouraged him to verbalize feelings and remain positive. Staff will monitor q52min for safety. Pt. Encouraged to attend karaoke. R: Pt. Is safe on the unit. Pt. Attended karaoke.

## 2013-02-22 NOTE — Progress Notes (Addendum)
[facility] Adult Case Management Discharge Plan :  Will you be returning to the same living situation after discharge: Yes,  returning to own home At discharge, do you have transportation home?:Yes,  family will pick pt up Do you have the ability to pay for your medications:Yes,  access to meds  Release of information consent forms completed and in the chart;  Patient's signature needed at discharge.  Patient to Follow up at: Follow-up Information   Follow up with [facility] On 02/28/2013. (Walk in between 7:45 - 11:00 am for hospital discharge appointment.  Referral # [id number])    Contact information:   Physical Address: [address] [place], [place] [address]  Mailing Address: [address] [place], [place] [address] Phone: [phone] Fax: [phone]      Patient denies SI/HI:   Yes,  denies SI/HI    Safety Planning and Suicide Prevention discussed:  Yes,  discussed with pt, pt refused contact with family (see suicide prevention note)  Wilkie Aye, Salome Arnt 02/26/2013 9:37 AM

## 2013-02-23 MED ORDER — BUSPIRONE HCL 10 MG PO TABS
15.0000 mg | ORAL_TABLET | Freq: Three times a day (TID) | ORAL | Status: DC
  Administered 2013-02-23 – 2013-02-26 (×9): 15 mg via ORAL
  Filled 2013-02-23 (×2): qty 3
  Filled 2013-02-23: qty 63
  Filled 2013-02-23 (×6): qty 3
  Filled 2013-02-23: qty 63
  Filled 2013-02-23: qty 3
  Filled 2013-02-23: qty 63
  Filled 2013-02-23 (×2): qty 3

## 2013-02-23 MED ORDER — METHOCARBAMOL 500 MG PO TABS
500.0000 mg | ORAL_TABLET | Freq: Three times a day (TID) | ORAL | Status: DC | PRN
  Administered 2013-02-25 – 2013-02-26 (×3): 500 mg via ORAL
  Filled 2013-02-23: qty 1
  Filled 2013-02-23: qty 12
  Filled 2013-02-23: qty 1

## 2013-02-23 MED ORDER — NABUMETONE 500 MG PO TABS
500.0000 mg | ORAL_TABLET | Freq: Two times a day (BID) | ORAL | Status: DC
  Administered 2013-02-23 – 2013-02-26 (×6): 500 mg via ORAL
  Filled 2013-02-23 (×5): qty 1
  Filled 2013-02-23 (×2): qty 28
  Filled 2013-02-23 (×3): qty 1

## 2013-02-23 NOTE — Progress Notes (Signed)
Psychoeducational Group Note  Date:  02/23/2013 Time:  1100am  Group Topic/Focus:  Managing Feelings:   The focus of this group is to identify what feelings patients have difficulty handling and develop a plan to handle them in a healthier way upon discharge.  Participation Level: Did Not Attend  Participation Quality:  Not Applicable  Affect:  Not Applicable  Cognitive:  Not Applicable  Insight:  Not Applicable  Engagement in Group: Not Applicable  Additional Comments:  Did not attend.  Nestor Ramp [facility] 02/23/2013, 2:35 PM

## 2013-02-23 NOTE — [facility] Group Notes (Signed)
[facility] LCSW Group Therapy  Relapse Prevention 1:15 - 2:30  02/23/2013 3:25 PM  Type of Therapy:  Group Therapy  Participation Level:  Active  Participation Quality:  Appropriate  Affect:  Appropriate  Cognitive:  Alert and Appropriate  Insight:  Engaged  Engagement in Therapy:  Engaged  Modes of Intervention:  Discussion, Education, Exploration, Problem-Solving, Rapport Building, Support   Summary of Progress/Problems:  Patient shared his current admission is a relapse after 15 years of doing well.  Patient shared he saw the signs of depression creeping in but he stuffed them back and refused to acknowledge he was going downhill.    Wynn Banker 02/23/2013, 3:25 PM

## 2013-02-23 NOTE — Progress Notes (Signed)
Chaplain follow up for continued support around grief.    While pt describes still having "ups and downs," spoke with chaplain about having periods of prayer and reflection and feeling different.  Spoke about feeling guilt around his anger toward God and describes being able to speak his anger during prayer.  Now feeling that God's presence is with him and that there are some purposes in his being present / alive.    Continued support around pt's realization that he has not been able to care for himself.  Spoke with chaplain about life not being set up to provide spaces to care for ourselves and discussed where he could find.  Stated that he views destruction and hurt in the world and sometimes feels that since all of his loved ones have moved on, he should end his life as well.  Chaplain brought up Jesus' prayer for the kingdom of God to be present on earth as it is in heaven and [place] and chaplain discussed how Jesus' life and ministry were being love and grace in the midst of the turmoil of the world.    Chaplain shared prayers with pt.    Pt feeling hopeful that his children are amenable to grief counseling.  They had been resistant when the school counselor had suggested, but were open to going with pt.

## 2013-02-23 NOTE — Progress Notes (Signed)
  D) Patient quiet but cooperative upon my assessment. Patient completed Patient Self Inventory, reports slept "well," and  appetite is "good." Patient rates depression as  7 /10, patient rates hopeless feelings as  3/10. Patient continues to endorse "off and on SI," contracts verbally for safety with RN. Patient denies HI, denies A/V hallucinations. Patient did not attend 1000 and 1100 groups this morning, patient states "I just feel down today."   A) Patient offered support and encouragement, patient encouraged to discuss feelings/concerns with staff. Patient verbalized understanding. Patient monitored Q15 minutes for safety. Patient met with MD  to discuss today's goals and plan of care.  R) Patient isolates to room at times, attending some groups in day room and all meals in dining room. Patient appropriate with staff and peers.   Patient taking medications as ordered. Will continue to monitor.

## 2013-02-23 NOTE — [facility] Group Notes (Signed)
[facility] LCSW Aftercare Discharge Planning Group Note   02/23/2013 12:54 PM  Participation Quality:  Appropriate  Mood/Affect:  Appropriate  Depression Rating:  6  Anxiety Rating:  7/8  Thoughts of Suicide:  Yes  Will you contract for safety?   Yes  Current AVH:  No  Plan for Discharge/Comments:  Patient reports not doing well today.  He shared he is having off/on SI and fears he would act on thoughts if discharged.  Transportation Means:  Patient has transportation.  Supports:  Patient has a good support system.   Airyanna Dipalma, [place] July

## 2013-02-23 NOTE — Progress Notes (Signed)
Adult Psychoeducational Group Note  Date:  02/23/2013 Time:  11:01 AM  Group Topic/Focus:  Therapeutic Activity-Coping Skills Pictionary   Participation Level:  Did Not Attend  Additional Comments:  Pt. Participated in coping skills pictionary in which team building and interactive learning were practiced.    Ruta Hinds Frazee 02/23/2013, 11:01 AM

## 2013-02-23 NOTE — Progress Notes (Signed)
[facility] MD Progress Note  02/23/2013 1:30 PM CACHE BILLS  MRN:  [id number] Subjective:  Patient did not sleep well last night, he woke frequently and could not get back to sleep due to all the things running through his head.  7/10 depression with suicidal thoughts, 10/10 anxiety.  He never had anxiety until a month ago when he had his first panic attack at [facility], has been isolating since then.  Appetite good, complained of right shoulder pain--was suppose to have surgery prior to admission (see orders below)  Diagnosis:   Axis I: Alcohol Abuse, Anxiety Disorder NOS and Major Depression, Recurrent severe Axis II: Deferred Axis III:  Past Medical History  Diagnosis Date  . Rotator cuff injury 02/18/2013    Right side, untreated   Axis IV: economic problems, occupational problems, other psychosocial or environmental problems, problems related to social environment and problems with primary support group Axis V: 41-50 serious symptoms  ADL's:  Intact  Sleep: Poor  Appetite:  Fair  Suicidal Ideation:  Plan:  None Intent:  None Means:  None Homicidal Ideation:  Denies  Psychiatric Specialty Exam: Review of Systems  Constitutional: Negative.   HENT: Negative.   Eyes: Negative.   Respiratory: Negative.   Gastrointestinal: Negative.   Genitourinary: Negative.   Musculoskeletal:       Right shoulder pain  Skin: Negative.   Neurological: Negative.   Endo/Heme/Allergies: Negative.   Psychiatric/Behavioral: Positive for depression and substance abuse. The patient is nervous/anxious.     Blood pressure 126/80, pulse 84, temperature 98.3 F (36.8 C), temperature source Oral, resp. rate 18, height 5\' 8"  (1.727 m), weight 83.008 kg (183 lb).Body mass index is 27.83 kg/(m^2).  General Appearance: Casual  Eye Contact::  Fair  Speech:  Slow  Volume:  Decreased  Mood:  Anxious and Depressed  Affect:  Congruent  Thought Process:  Coherent  Orientation:  Full (Time, Place, and Person)   Thought Content:  WDL  Suicidal Thoughts:  Yes.  without intent/plan  Homicidal Thoughts:  No  Memory:  Immediate;   Fair Recent;   Fair Remote;   Fair  Judgement:  Fair  Insight:  Fair  Psychomotor Activity:  Decreased  Concentration:  Fair  Recall:  Fair  Akathisia:  No  Handed:  Right  AIMS (if indicated):     Assets:  Communication Skills Physical Health Resilience  Sleep:  Number of Hours: 6.25   Current Medications: Current Facility-Administered Medications  Medication Dose Route Frequency Provider Last Rate Last Dose  . acetaminophen (TYLENOL) tablet 650 mg  650 mg Oral Q6H PRN Shuvon Rankin, NP      . alum & mag hydroxide-simeth (MAALOX/MYLANTA) 200-200-20 MG/5ML suspension 30 mL  30 mL Oral Q4H PRN Shuvon Rankin, NP      . busPIRone (BUSPAR) tablet 15 mg  15 mg Oral TID Nanine Means, NP      . DULoxetine (CYMBALTA) DR capsule 60 mg  60 mg Oral Daily Rachael Fee, MD   60 mg at 02/23/13 0731  . feeding supplement (ENSURE COMPLETE) liquid 237 mL  237 mL Oral BID PRN Jeoffrey Massed, RD      . gabapentin (NEURONTIN) capsule 300 mg  300 mg Oral TID Rachael Fee, MD   300 mg at 02/23/13 1206  . hydrOXYzine (ATARAX/VISTARIL) tablet 25 mg  25 mg Oral Q6H PRN Nanine Means, NP   25 mg at 02/23/13 0731  . magnesium hydroxide (MILK OF MAGNESIA) suspension 30 mL  30 mL Oral Daily PRN Shuvon Rankin, NP      . methocarbamol (ROBAXIN) tablet 500 mg  500 mg Oral Q8H PRN Nanine Means, NP      . multivitamin with minerals tablet 1 tablet  1 tablet Oral Daily Jeoffrey Massed, RD   1 tablet at 02/23/13 1610  . nabumetone (RELAFEN) tablet 500 mg  500 mg Oral BID Nanine Means, NP      . nicotine (NICODERM CQ - dosed in mg/24 hours) patch 21 mg  21 mg Transdermal Daily Nanine Means, NP   21 mg at 02/23/13 9604  . traZODone (DESYREL) tablet 150 mg  150 mg Oral QHS PRN,MR X 1 Rachael Fee, MD   150 mg at 02/22/13 2123    Lab Results: No results found for this or any previous visit (from the  past 48 hour(s)).  Physical Findings: AIMS: Facial and Oral Movements Muscles of Facial Expression: None, normal Lips and Perioral Area: None, normal Jaw: None, normal Tongue: None, normal,Extremity Movements Upper (arms, wrists, hands, fingers): None, normal Lower (legs, knees, ankles, toes): None, normal, Trunk Movements Neck, shoulders, hips: None, normal, Overall Severity Severity of abnormal movements (highest score from questions above): None, normal Incapacitation due to abnormal movements: None, normal Patient's awareness of abnormal movements (rate only patient's report): No Awareness, Dental Status Current problems with teeth and/or dentures?: No Does patient usually wear dentures?: No  CIWA:  CIWA-Ar Total: 1 COWS:     Treatment Plan Summary: Daily contact with patient to assess and evaluate symptoms and progress in treatment Medication management Supportive approach/coping skills/grief and loss/reassess medications, optimize response Plan:  Review of chart, vital signs, medications, and notes. 1-Individual and group therapy 2-Medication management for depression and anxiety:  Medications reviewed with the patient and he stated his anxiety was increasing--buspar TID ordered, complained of right shoulder pain--Relafen and Robaxin ordered 3-Coping skills for depression, anxiety, and alcohol abuse 4-Continue crisis stabilization and management 5-Address health issues--monitoring vital signs, stable 6-Treatment plan in progress to prevent relapse of depression, alcohol abuse, and anxiety  Medical Decision Making Problem Points:  Established problem, stable/improving (1) and Review of psycho-social stressors (1) Data Points:  Review of new medications or change in dosage (2)  I certify that inpatient services furnished can reasonably be expected to improve the patient's condition.   Nanine Means, PMH-NP 02/23/2013, 1:30 PM

## 2013-02-24 DIAGNOSIS — F329 Major depressive disorder, single episode, unspecified: Secondary | ICD-10-CM

## 2013-02-24 NOTE — [facility] Group Notes (Signed)
[facility] Group Notes:  (Clinical Social Work)  02/24/2013   3:00-4:00PM  Summary of Progress/Problems:   The main focus of today's process group was to identify the actions, thoughts, and feelings which led to this hospitalization.  An activity was done in which patients used both left and right brains, drawing a picture of the events leading up to their hospital stay, writing a list of the many thoughts they had at that time, then drawing/writing their feelings at that same time.  We discussed the differences between these three elements and a diagram was used to show how each element of Actions-Thoughts-Feelings can change the others.  CSW encouraged patient to use both medication and therapy to approach their mental health.   The patient expressed that he had a suicide attempt following all the stressors of his wife's death, his mother's death, and his sister-in-law's and drew 3 tombs in his picture as an expression of his grief.  He is trying to raise 3 teenagers as well, who are grieving.  He seemed to enjoy the drawing part of this exercise and it may be helpful for him to continue using this technique.  Type of Therapy:  Process Group  Participation Level:  Active  Participation Quality:  Appropriate, Attentive and Sharing  Affect:  Depressed and Flat  Cognitive:  Appropriate and Oriented  Insight:  Engaged  Engagement in Therapy:  Engaged  Modes of Intervention:  Activity, Discussion  Ambrose Mantle, LCSW 02/24/2013, 2:07 PM

## 2013-02-24 NOTE — Progress Notes (Signed)
Adult Psychoeducational Group Note  Date:  02/24/2013 Time:  1:20 PM  Group Topic/Focus:  Healthy Communication:   The focus of this group is to discuss communication, barriers to communication, as well as healthy ways to communicate with others.  Participation Level:  Active  Participation Quality:  Appropriate, Attentive and Supportive  Affect:  Appropriate  Cognitive:  Alert and Appropriate  Insight: Appropriate  Engagement in Group:  Engaged  Modes of Intervention:  Discussion  Additional Comments:  Pt was appropriate and sharing while attending group. Pt shared that Quality Time with kids is a love language for him . Pt stated that he also likes for his kids to help him with things around the house. Pt agreed that even children need acts of love and quality time.   Sharyn Lull 02/24/2013, 1:20 PM

## 2013-02-24 NOTE — Progress Notes (Signed)
Patient ID: EURA RADABAUGH, male   DOB: 1966-06-02, 47 y.o.   MRN: [id number]  D: Pt was on the telephone for part of the shift. Had to be asked several times to discontinue his call. After several minutes pt hung up and apologized to Retail banker. Pt reiterated what the previous RN reported in reference to his losses.  Pt stated, "within a year I lost my wife, her twin and their mother". Pt stated that they all lived together.  Stated he'd been on antidepressants 14 yrs ago, but had the dr wean him off.   A:  Support and encouragement was offered. 15 min checks continued for safety.  R: Pt remains safe.

## 2013-02-24 NOTE — Progress Notes (Signed)
D: Pt has been pleasant and cooperative throughout shift. attended groups and participated well. Appetite has been good with normal energy level as he stated in group and and on patient self inventory sheet. Continued to comment on the events of stressors in his life that has led to his current admission.  A: pt has been given some words of encouragements by this rn including focusing on the positives  and not hesistating to voice concerns to rn and other staff. Pt reassured of a safe and therapeutic environment in which he finds himself and to use group sessions to the fullest for best results. 15 mins checks continues.  R: pt's safety is maintained and pt is progressing accordingly.

## 2013-02-24 NOTE — Progress Notes (Signed)
[facility] MD Progress Note  02/24/2013 3:01 PM Aaron [name]  MRN:  [id number] Subjective:  Aaron [name] endorses that he had a rough afternoon. He heard that his oldest daughter had made comments that he percieved as not being supportive of him. This brought him down. His mood is still unstable but overall he states he is recovering faster when he feels like this. He states he understands he needs to move on and be there for his kids Diagnosis:  MDD, PTSD  ADL's:  Intact  Sleep: Fair  Appetite:  Fair  Suicidal Ideation:  Plan:  denies Intent:  denies Means:  denies Homicidal Ideation:  Plan:  denies Intent:  denies Means:  denies AEB (as evidenced by):  Psychiatric Specialty Exam: Review of Systems  Constitutional: Negative.   HENT: Negative.   Eyes: Negative.   Respiratory: Negative.   Cardiovascular: Negative.   Gastrointestinal: Negative.   Genitourinary: Negative.   Musculoskeletal: Negative.   Skin: Negative.   Endo/Heme/Allergies: Negative.   Psychiatric/Behavioral: Positive for depression. The patient is nervous/anxious.     Blood pressure 135/81, pulse 71, temperature 98.3 F (36.8 C), temperature source Oral, resp. rate 22, height 5\' 8"  (1.727 m), weight 83.008 kg (183 lb).Body mass index is 27.83 kg/(m^2).  General Appearance: Fairly Groomed  Patent attorney::  Fair  Speech:  Clear and Coherent and Slow  Volume:  Decreased  Mood:  Depressed  Affect:  Restricted  Thought Process:  Coherent and Goal Directed  Orientation:  Full (Time, Place, and Person)  Thought Content:  worries, concerns  Suicidal Thoughts:  No  Homicidal Thoughts:  No  Memory:  Immediate;   Fair Recent;   Fair Remote;   Fair  Judgement:  Fair  Insight:  Present  Psychomotor Activity:  Decreased  Concentration:  Fair  Recall:  Fair  Akathisia:  No  Handed:  Right  AIMS (if indicated):     Assets:  Desire for Improvement  Sleep:  Number of Hours: 6.25   Current Medications: Current  Facility-Administered Medications  Medication Dose Route Frequency Provider Last Rate Last Dose  . acetaminophen (TYLENOL) tablet 650 mg  650 mg Oral Q6H PRN Shuvon Rankin, NP      . alum & mag hydroxide-simeth (MAALOX/MYLANTA) 200-200-20 MG/5ML suspension 30 mL  30 mL Oral Q4H PRN Shuvon Rankin, NP      . busPIRone (BUSPAR) tablet 15 mg  15 mg Oral TID Nanine Means, NP   15 mg at 02/24/13 1140  . DULoxetine (CYMBALTA) DR capsule 60 mg  60 mg Oral Daily Rachael Fee, MD   60 mg at 02/24/13 0847  . feeding supplement (ENSURE COMPLETE) liquid 237 mL  237 mL Oral BID PRN Jeoffrey Massed, RD      . gabapentin (NEURONTIN) capsule 300 mg  300 mg Oral TID Rachael Fee, MD   300 mg at 02/24/13 1139  . hydrOXYzine (ATARAX/VISTARIL) tablet 25 mg  25 mg Oral Q6H PRN Nanine Means, NP   25 mg at 02/24/13 0847  . magnesium hydroxide (MILK OF MAGNESIA) suspension 30 mL  30 mL Oral Daily PRN Shuvon Rankin, NP      . methocarbamol (ROBAXIN) tablet 500 mg  500 mg Oral Q8H PRN Nanine Means, NP      . multivitamin with minerals tablet 1 tablet  1 tablet Oral Daily Jeoffrey Massed, RD   1 tablet at 02/24/13 0846  . nabumetone (RELAFEN) tablet 500 mg  500 mg Oral BID Nanine Means,  NP   500 mg at 02/24/13 0847  . nicotine (NICODERM CQ - dosed in mg/24 hours) patch 21 mg  21 mg Transdermal Daily Nanine Means, NP   21 mg at 02/24/13 0612  . traZODone (DESYREL) tablet 150 mg  150 mg Oral QHS PRN,MR X 1 Rachael Fee, MD   150 mg at 02/23/13 2203    Lab Results: No results found for this or any previous visit (from the past 48 hour(s)).  Physical Findings: AIMS: Facial and Oral Movements Muscles of Facial Expression: None, normal Lips and Perioral Area: None, normal Jaw: None, normal Tongue: None, normal,Extremity Movements Upper (arms, wrists, hands, fingers): None, normal Lower (legs, knees, ankles, toes): None, normal, Trunk Movements Neck, shoulders, hips: None, normal, Overall Severity Severity of abnormal  movements (highest score from questions above): None, normal Incapacitation due to abnormal movements: None, normal Patient's awareness of abnormal movements (rate only patient's report): No Awareness, Dental Status Current problems with teeth and/or dentures?: No Does patient usually wear dentures?: No  CIWA:  CIWA-Ar Total: 1 COWS:     Treatment Plan Summary: Daily contact with patient to assess and evaluate symptoms and progress in treatment Medication management  Plan: Supportive approach/coping skills           CBT/grief/loss  Medical Decision Making Problem Points:  Review of last therapy session (1) and Review of psycho-social stressors (1) Data Points:  Review of medication regiment & side effects (2)  I certify that inpatient services furnished can reasonably be expected to improve the patient's condition.   Barbera Perritt A 02/24/2013, 3:01 PM

## 2013-02-25 NOTE — Progress Notes (Signed)
Patient ID: Aaron [name], male   DOB: [birth year], 47 y.o.   MRN: [id number]  Was out in the dayroom this evening, watching tv and interacting appropriately with staff and peers, attended group, came to med window afterward for hs meds. A)  Will continue to monitor for safety, continue POC, support, encouragement. R) safety maintained.

## 2013-02-25 NOTE — Progress Notes (Signed)
[name] MD Progress Note  02/25/2013 4:23 PM Aaron [name]  MRN:  [id number] Subjective:  Aaron [name] endorses that things are falling in place for him. He has been dealing with several stressful events while being here and feels he has been able to handle them. He is still concerned of when he gets out of here but he is more hopeful today than he has been. States that he knows that he needs to go to Hospice with his kids as they all need the help. He is concerned about how they have handled their own grief given that he hasn't been totally available emotionally to them Diagnosis:  Major Depression recurrent, PTSD  ADL's:  Intact  Sleep: Fair  Appetite:  Fair  Suicidal Ideation:  Plan:  denies Intent:  denies Means:  denies Homicidal Ideation:  Plan:  denies Intent:  denies Means:  denies AEB (as evidenced by):  Psychiatric Specialty Exam: Review of Systems  Constitutional: Negative.   HENT: Negative.   Eyes: Negative.   Respiratory: Negative.   Cardiovascular: Negative.   Gastrointestinal: Negative.   Genitourinary: Negative.   Musculoskeletal: Negative.   Skin: Negative.   Neurological: Negative.   Endo/Heme/Allergies: Negative.   Psychiatric/Behavioral: Positive for depression. The patient is nervous/anxious.     Blood pressure 131/83, pulse 82, temperature 98.3 F (36.8 C), temperature source Oral, resp. rate 18, height 5\' 8"  (1.727 m), weight 83.008 kg (183 lb).Body mass index is 27.83 kg/(m^2).  General Appearance: Fairly Groomed  Patent attorney::  Fair  Speech:  Clear and Coherent  Volume:  Normal  Mood:  worried  Affect:  Appropriate  Thought Process:  Coherent and Goal Directed  Orientation:  Full (Time, Place, and Person)  Thought Content:  worries, concerns  Suicidal Thoughts:  No  Homicidal Thoughts:  No  Memory:  Immediate;   Fair Recent;   Fair Remote;   Fair  Judgement:  Fair  Insight:  Present  Psychomotor Activity:  Normal  Concentration:  Fair  Recall:   Fair  Akathisia:  No  Handed:  Right  AIMS (if indicated):     Assets:  Desire for Improvement  Sleep:  Number of Hours: 6.5   Current Medications: Current Facility-Administered Medications  Medication Dose Route Frequency Provider Last Rate Last Dose  . acetaminophen (TYLENOL) tablet 650 mg  650 mg Oral Q6H PRN Shuvon Rankin, NP   650 mg at 02/25/13 0834  . alum & mag hydroxide-simeth (MAALOX/MYLANTA) 200-200-20 MG/5ML suspension 30 mL  30 mL Oral Q4H PRN Shuvon Rankin, NP      . busPIRone (BUSPAR) tablet 15 mg  15 mg Oral TID Nanine Means, NP   15 mg at 02/25/13 1147  . DULoxetine (CYMBALTA) DR capsule 60 mg  60 mg Oral Daily Rachael Fee, MD   60 mg at 02/25/13 4098  . feeding supplement (ENSURE COMPLETE) liquid 237 mL  237 mL Oral BID PRN Jeoffrey Massed, RD      . gabapentin (NEURONTIN) capsule 300 mg  300 mg Oral TID Rachael Fee, MD   300 mg at 02/25/13 1148  . hydrOXYzine (ATARAX/VISTARIL) tablet 25 mg  25 mg Oral Q6H PRN Nanine Means, NP   25 mg at 02/25/13 0828  . magnesium hydroxide (MILK OF MAGNESIA) suspension 30 mL  30 mL Oral Daily PRN Shuvon Rankin, NP      . methocarbamol (ROBAXIN) tablet 500 mg  500 mg Oral Q8H PRN Nanine Means, NP   500 mg at 02/25/13  1610  . multivitamin with minerals tablet 1 tablet  1 tablet Oral Daily Jeoffrey Massed, RD   1 tablet at 02/25/13 0827  . nabumetone (RELAFEN) tablet 500 mg  500 mg Oral BID Nanine Means, NP   500 mg at 02/25/13 0827  . nicotine (NICODERM CQ - dosed in mg/24 hours) patch 21 mg  21 mg Transdermal Daily Nanine Means, NP   21 mg at 02/25/13 0650  . traZODone (DESYREL) tablet 150 mg  150 mg Oral QHS PRN,MR X 1 Rachael Fee, MD   150 mg at 02/24/13 2303    Lab Results: No results found for this or any previous visit (from the past 48 hour(s)).  Physical Findings: AIMS: Facial and Oral Movements Muscles of Facial Expression: None, normal Lips and Perioral Area: None, normal Jaw: None, normal Tongue: None, normal,Extremity  Movements Upper (arms, wrists, hands, fingers): None, normal Lower (legs, knees, ankles, toes): None, normal, Trunk Movements Neck, shoulders, hips: None, normal, Overall Severity Severity of abnormal movements (highest score from questions above): None, normal Incapacitation due to abnormal movements: None, normal Patient's awareness of abnormal movements (rate only patient's report): No Awareness, Dental Status Current problems with teeth and/or dentures?: No Does patient usually wear dentures?: No  CIWA:  CIWA-Ar Total: 1 COWS:     Treatment Plan Summary: Daily contact with patient to assess and evaluate symptoms and progress in treatment Medication management  Plan: Supportive approach/coping skills/relapse prevention/grief-loss            Optimize treatment with psychotropics  Medical Decision Making Problem Points:  Review of last therapy session (1) and Review of psycho-social stressors (1) Data Points:  Review of medication regiment & side effects (2)  I certify that inpatient services furnished can reasonably be expected to improve the patient's condition.   Madina Galati A 02/25/2013, 4:23 PM

## 2013-02-25 NOTE — Progress Notes (Signed)
D. Pt has been up and has been active while in milieu today, has been attending and participating in various milieu activities. Pt has spoke about how he is feeling better, still does endorse some feelings of depression but denies any SI today. Pt has received medications today without incident. A. Support and encouragement provided, medication education done. R. Pt verbalized understanding, all belongings returned.

## 2013-02-25 NOTE — [facility] Group Notes (Addendum)
[facility] Group Notes:  (Clinical Social Work)  02/25/2013   3:00-4:00PM  Summary of Progress/Problems:   The main focus of today's process group was to   identify the patient's current support system and decide on other supports that can be put in place.  Four definitions/levels of support were discussed and an exercise was utilized to show how much stronger we become with additional supports.  An emphasis was placed on using counselor, doctor, therapy groups, 12-step groups, and problem-specific support groups to expand supports, as well as doing something different than has been done before. The patient expressed a willingness to add support groups, family grief counseling, individual grief counseling to his current supports.  These are already set up by his regular CSW and he is very excited.  Type of Therapy:  Process Group  Participation Level:  Active  Participation Quality:  Appropriate, Attentive, Sharing and Supportive  Affect:  Blunted and Depressed  Cognitive:  Alert, Appropriate and Oriented  Insight:  Engaged  Engagement in Therapy:  Engaged  Modes of Intervention:  Education,  Support and [employer], LCSW 02/25/2013, 12:18 PM

## 2013-02-25 NOTE — Progress Notes (Signed)
Psychoeducational Group Note  Date:  02/25/2013 Time:  1015  Group Topic/Focus:  Making Healthy Choices:   The focus of this group is to help patients identify negative/unhealthy choices they were using prior to admission and identify positive/healthier coping strategies to replace them upon discharge.  Participation Level:  Active  Participation Quality:  Appropriate  Affect:  Appropriate  Cognitive:  Appropriate  Insight:  Improving  Engagement in Group:  Improving  Additional Comments:    Diesha Rostad A 02/25/2013 

## 2013-02-25 NOTE — Progress Notes (Signed)
Psychoeducational Group Note  Psychoeducational Group Note  Date: 02/25/2013 Time:  02/25/2013  Group Topic/Focus:  Gratefulness:  The focus of this group is to help patients identify what two things they are most grateful for in their lives. What helps ground them and to center them on their work to their recovery.  Participation Level:  Active  Participation Quality:  Appropriate  Affect:  Appropriate  Cognitive:  Appropriate  Insight:  Improving  Engagement in Group:  Engaged  Additional Comments:  Pt is grateful for his salvation, his family and for his 4 son's  Dione Housekeeper

## 2013-02-26 MED ORDER — HYDROXYZINE HCL 25 MG PO TABS: 25.0000 mg | ORAL_TABLET | Freq: Four times a day (QID) | ORAL | Status: DC | PRN

## 2013-02-26 MED ORDER — TRAZODONE HCL 100 MG PO TABS
150.0000 mg | ORAL_TABLET | Freq: Every evening | ORAL | Status: DC | PRN
  Filled 2013-02-26: qty 42

## 2013-02-26 MED ORDER — GABAPENTIN 300 MG PO CAPS: 300.0000 mg | ORAL_CAPSULE | Freq: Three times a day (TID) | ORAL | Status: DC

## 2013-02-26 MED ORDER — NABUMETONE 500 MG PO TABS: 500.0000 mg | ORAL_TABLET | Freq: Two times a day (BID) | ORAL | Status: DC

## 2013-02-26 MED ORDER — DULOXETINE HCL 60 MG PO CPEP: 60.0000 mg | ORAL_CAPSULE | Freq: Every day | ORAL | Status: DC

## 2013-02-26 MED ORDER — TRAZODONE HCL 150 MG PO TABS: 150.0000 mg | ORAL_TABLET | Freq: Every evening | ORAL | Status: DC | PRN

## 2013-02-26 MED ORDER — BUSPIRONE HCL 15 MG PO TABS: 15.0000 mg | ORAL_TABLET | Freq: Three times a day (TID) | ORAL | Status: DC

## 2013-02-26 MED ORDER — METHOCARBAMOL 500 MG PO TABS: 500.0000 mg | ORAL_TABLET | Freq: Three times a day (TID) | ORAL | Status: DC | PRN

## 2013-02-26 NOTE — Progress Notes (Addendum)
D:  Patient's self inventory sheet, patient sleeps well, has good appetite, normal energy level, good attention span.  Rated depression #2, denied hopelessness.  Denied withdrawals.  Denied SI.  Has experienced pain in past 24 hours.  Pain goal #2, worst pain #7.  "Thank you for everything!  You all have been so nice and helpful!  Such a blessing!"  Does have discharge plans.  No problems taking meds after discharge. A:  Medications administered per MD orders.  Emotional support and encouragement given to patient.  Staff will monitor every 15 minutes for safety.   R:  Patient denied SI and HI.  Denied A/V hallucinations.  Denied pain.  Patient remains safe on unit.

## 2013-02-26 NOTE — Progress Notes (Addendum)
Discharge Note:  Patient discharged to home with family member.   Denied SI and HI.  Denied A/V hallucinations.  Denied pain.  Stated he received all his belongings, clothing, prescriptions, meds.  Stated he appreciated all the staff has done to assist him while at [facility].  Suicide prevention information given to and discussed with patient, who stated he understood and had no questions.  Patient stated he appreciated all the staff has done to assist him while at [facility].

## 2013-02-26 NOTE — [facility] Suicide Risk Assessment (Signed)
Suicide Risk Assessment  Discharge Assessment     Demographic Factors:  Male, Divorced or widowed and Caucasian  Mental Status Per Nursing Assessment::   On Admission:     Current Mental Status by Physician: In full contact with reality. There are no suicidal ideas, plans or intent. His mood is "much better" affect is appropriate. States he feels ready to get out of here and do the work he has to do. He wants to be there for the kids. He is going to follow up with therapy and Hospice   Loss Factors: Loss of significant relationship  Historical Factors: NA  Risk Reduction Factors:   Responsible for children under 53 years of age, Sense of responsibility to family, Living with another person, especially a relative and Positive social support  Continued Clinical Symptoms:  Depression:   Comorbid alcohol abuse/dependence  Cognitive Features That Contribute To Risk: None identified   Suicide Risk:  Minimal: No identifiable suicidal ideation.  Patients presenting with no risk factors but with morbid ruminations; may be classified as minimal risk based on the severity of the depressive symptoms  Discharge Diagnoses:   AXIS I:  Major Depression recurrent, PTSD, Alcohol Abuse AXIS II:  Deferred AXIS III:   Past Medical History  Diagnosis Date  . Rotator cuff injury 02/18/2013    Right side, untreated   AXIS IV:  occupational problems and other psychosocial or environmental problems AXIS V:  61-70 mild symptoms  Plan Of Care/Follow-up recommendations:  Activity:  as tolerated Diet:  regular Follow up outpatient therapist/Hospice Is patient on multiple antipsychotic therapies at discharge:  No   Has Patient had three or more failed trials of antipsychotic monotherapy by history:  No  Recommended Plan for Multiple Antipsychotic Therapies: N/A   Daziyah Cogan A 02/26/2013, 1:36 PM

## 2013-02-26 NOTE — Tx Team (Signed)
Interdisciplinary Treatment Plan Update (Adult)  Date: 02/26/13 Time Reviewed:  9:45 AM  Progress in Treatment: Attending groups: Yes Participating in groups:  Yes Taking medication as prescribed:  Yes Tolerating medication:  Yes Family/Significant othe contact made: Pt refused consent Patient understands diagnosis:  Yes Discussing patient identified problems/goals with staff:  Yes Medical problems stabilized or resolved:  Yes Denies suicidal/homicidal ideation: Yes Issues/concerns per patient self-inventory:  Yes Other:  New problem(s) identified: N/A  Discharge Plan or Barriers: Pt has follow up scheduled with Arna Medici for medication management and therapy.    Reason for Continuation of Hospitalization: Stable to d/c  Comments: N/A  Estimated length of stay: D/C today  For review of initial/current patient goals, please see plan of care.  Attendees: Patient:     Family:     Physician:   02/26/2013 10:03 AM   Nursing:   Neill Loft, RN 02/26/2013 10:03 AM   Clinical Social Worker:  Reyes Ivan, LCSWA 02/26/2013 10:03 AM   Other: Quintella Reichert, RN 02/26/2013 10:03 AM   Other:  Fransisca Kaufmann, NP 02/26/2013 10:04 AM   Other:  Nanine Means, NP 02/26/2013 10:04 AM   Other:  Juline Patch, LCSW 02/26/2013 10:04 AM   Other:    Other:    Other:    Other:    Other:    Other:     Scribe for Treatment Team:   Carmina Miller, 02/26/2013 10:03 AM

## 2013-02-26 NOTE — [facility] Group Notes (Signed)
[facility] LCSW Aftercare Discharge Planning Group Note   02/26/2013 8:45 AM  Participation Quality:  Alert and Appropriate   Mood/Affect:  Appropriate and Calm  Depression Rating:  2  Anxiety Rating:  0  Thoughts of Suicide:  Pt denies SI/HI  Will you contract for safety?   Yes  Current AVH:  No  Plan for Discharge/Comments:  Pt attended discharge planning group and actively participated in group.  CSW provided pt with today's workbook.  Pt states that he is doing well today and is ready to d/c today.  Pt is aware of his follow up with Arna Medici, as well as the info provided by CSW previously on outpatient therapy for his children with [facility] and Hospice for grief/loss counseling.  Pt also requested info on Vocational Rehab, which CSW provided to pt in his d/c paperwork.  No further needs voiced by pt at this time.    Transportation Means: Pt's sister in law will pick pt up  Supports: Family is supportive  Reyes Ivan, LCSWA 02/26/2013 10:28 AM
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
## 2013-02-26 NOTE — Progress Notes (Signed)
Patient ID: Aaron [name], male   DOB: 06-26-1966, 47 y.o.   MRN: [id number] D)  Has been pleasant and conversational this evening, states is feeling better and has gained insight from his stay.  Is brighter, sense of humor returning, interacting well with staff and peers.  States still some discomfort in rt shoulder d/t torn rotator cuff, was given robaxin, states needs to have surgery at some point.  Spent most of the evening in the dayroom, watching tv, attended group.  Denies thoughts of self harm. A)  Will continue to monitor for safety, continue POC, support, encouragement. R)  Safety maintained, appreciative.

## 2013-03-01 NOTE — Progress Notes (Signed)
Patient Discharge Instructions:  After Visit Summary (AVS):   Faxed to:  03/01/13 Discharge Summary Note:   Faxed to:  03/01/13 Psychiatric Admission Assessment Note:   Faxed to:  03/01/13 Suicide Risk Assessment - Discharge Assessment:   Faxed to:  03/01/13 Faxed/Sent to the Next Level Care provider:  03/01/13 Faxed to [facility] @ [phone]  Jerelene Redden, 03/01/2013, 3:12 PM

## 2013-12-03 ENCOUNTER — Encounter (HOSPITAL_COMMUNITY): Payer: Self-pay | Admitting: Emergency Medicine

## 2013-12-03 ENCOUNTER — Emergency Department (HOSPITAL_COMMUNITY)
Admission: EM | Admit: 2013-12-03 | Discharge: 2013-12-03 | Disposition: A | Discharge status: Home / Self Care | Payer: Self-pay | Attending: Emergency Medicine | Admitting: Emergency Medicine

## 2013-12-03 ENCOUNTER — Emergency Department (HOSPITAL_COMMUNITY): Payer: Self-pay

## 2013-12-03 DIAGNOSIS — IMO0002 Reserved for concepts with insufficient information to code with codable children: Secondary | ICD-10-CM | POA: Insufficient documentation

## 2013-12-03 DIAGNOSIS — F17200 Nicotine dependence, unspecified, uncomplicated: Secondary | ICD-10-CM | POA: Insufficient documentation

## 2013-12-03 DIAGNOSIS — Y9389 Activity, other specified: Secondary | ICD-10-CM | POA: Insufficient documentation

## 2013-12-03 DIAGNOSIS — Y92410 Unspecified street and highway as the place of occurrence of the external cause: Secondary | ICD-10-CM | POA: Insufficient documentation

## 2013-12-03 DIAGNOSIS — Z79899 Other long term (current) drug therapy: Secondary | ICD-10-CM | POA: Insufficient documentation

## 2013-12-03 DIAGNOSIS — Z791 Long term (current) use of non-steroidal anti-inflammatories (NSAID): Secondary | ICD-10-CM | POA: Insufficient documentation

## 2013-12-03 DIAGNOSIS — S43409A Unspecified sprain of unspecified shoulder joint, initial encounter: Secondary | ICD-10-CM

## 2013-12-03 MED ORDER — NAPROXEN 500 MG PO TABS: 500.0000 mg | ORAL_TABLET | Freq: Two times a day (BID) | ORAL | Status: DC

## 2013-12-03 MED ORDER — HYDROCODONE-ACETAMINOPHEN 5-325 MG PO TABS: ORAL_TABLET | ORAL | Status: DC
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
## 2013-12-03 NOTE — Discharge Instructions (Signed)
Shoulder Sprain  A shoulder sprain is the result of damage to the tough, fiber-like tissues (ligaments) that help hold your shoulder in place. The ligaments may be stretched or torn. Besides the main shoulder joint (the ball and socket), there are several smaller joints that connect the bones in this area. A sprain usually involves one of those joints. Most often it is the acromioclavicular (or AC) joint. That is the joint that connects the collarbone (clavicle) and the shoulder blade (scapula) at the top point of the shoulder blade (acromion).  A shoulder sprain is a mild form of what is called a shoulder separation. Recovering from a shoulder sprain may take some time. For some, pain lingers for several months. Most people recover without long term problems.  CAUSES    A shoulder sprain is usually caused by some kind of trauma. This might be:   Falling on an outstretched arm.   Being hit hard on the shoulder.   Twisting the arm.   Shoulder sprains are more likely to occur in people who:   Play sports.   Have balance or coordination problems.  SYMPTOMS    Pain when you move your shoulder.   Limited ability to move the shoulder.   Swelling and tenderness on top of the shoulder.   Redness or warmth in the shoulder.   Bruising.   A change in the shape of the shoulder.  DIAGNOSIS   Your healthcare provider may:   Ask about your symptoms.   Ask about recent activity that might have caused those symptoms.   Examine your shoulder. You may be asked to do simple exercises to test movement. The other shoulder will be examined for comparison.   Order some tests that provide a look inside the body. They can show the extent of the injury. The tests could include:   X-rays.   CT (computed tomography) scan.   MRI (magnetic resonance imaging) scan.  RISKS AND COMPLICATIONS   Loss of full shoulder motion.   Ongoing shoulder pain.  TREATMENT   How long it takes to recover from a shoulder sprain depends on how  severe it was. Treatment options may include:   Rest. You should not use the arm or shoulder until it heals.   Ice. For 2 or 3 days after the injury, put an ice pack on the shoulder up to 4 times a day. It should stay on for 15 to 20 minutes each time. Wrap the ice in a towel so it does not touch your skin.   Over-the-counter medicine to relieve pain.   A sling or brace. This will keep the arm still while the shoulder is healing.   Physical therapy or rehabilitation exercises. These will help you regain strength and motion. Ask your healthcare provider when it is OK to begin these exercises.   Surgery. The need for surgery is rare with a sprained shoulder, but some people may need surgery to keep the joint in place and reduce pain.  HOME CARE INSTRUCTIONS    Ask your healthcare provider about what you should and should not do while your shoulder heals.   Make sure you know how to apply ice to the correct area of your shoulder.   Talk with your healthcare provider about which medications should be used for pain and swelling.   If rehabilitation therapy will be needed, ask your healthcare provider to refer you to a therapist. If it is not recommended, then ask about at-home exercises. Find   out when exercise should begin.  SEEK MEDICAL CARE IF:   Your pain, swelling, or redness at the joint increases.  SEEK IMMEDIATE MEDICAL CARE IF:    You have a fever.   You cannot move your arm or shoulder.  Document Released: 02/13/2009 Document Revised: 12/20/2011 Document Reviewed: 02/13/2009  [employer] Patient Information 2014 [employer]
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
## 2013-12-06 NOTE — ED Provider Notes (Signed)
Medical screening examination/treatment/procedure(s) were performed by non-physician practitioner and as supervising physician I was immediately available for consultation/collaboration.  EKG Interpretation   None        Aymar Whitfill, MD 12/06/13 2037 

## 2016-08-01 ENCOUNTER — Emergency Department (HOSPITAL_COMMUNITY): Payer: Self-pay

## 2016-08-01 ENCOUNTER — Encounter (HOSPITAL_COMMUNITY): Payer: Self-pay | Admitting: Emergency Medicine

## 2016-08-01 ENCOUNTER — Emergency Department (HOSPITAL_COMMUNITY)
Admission: EM | Admit: 2016-08-01 | Discharge: 2016-08-01 | Disposition: A | Discharge status: Home / Self Care | Payer: Self-pay | Attending: Emergency Medicine | Admitting: Emergency Medicine

## 2016-08-01 DIAGNOSIS — Y9389 Activity, other specified: Secondary | ICD-10-CM | POA: Insufficient documentation

## 2016-08-01 DIAGNOSIS — S51812A Laceration without foreign body of left forearm, initial encounter: Secondary | ICD-10-CM | POA: Insufficient documentation

## 2016-08-01 DIAGNOSIS — F17210 Nicotine dependence, cigarettes, uncomplicated: Secondary | ICD-10-CM | POA: Insufficient documentation

## 2016-08-01 DIAGNOSIS — W268XXA Contact with other sharp object(s), not elsewhere classified, initial encounter: Secondary | ICD-10-CM | POA: Insufficient documentation

## 2016-08-01 DIAGNOSIS — Y929 Unspecified place or not applicable: Secondary | ICD-10-CM | POA: Insufficient documentation

## 2016-08-01 DIAGNOSIS — Y999 Unspecified external cause status: Secondary | ICD-10-CM | POA: Insufficient documentation

## 2016-08-01 MED ORDER — LIDOCAINE HCL (PF) 1 % IJ SOLN
5.0000 mL | Freq: Once | INTRAMUSCULAR | Status: AC
  Administered 2016-08-01: 5 mL
  Filled 2016-08-01: qty 5

## 2016-08-01 NOTE — ED Triage Notes (Signed)
Pt with laceration to his L forearm from sheet metal that fell. Bleeding controlled.

## 2016-08-01 NOTE — ED Provider Notes (Signed)
[facility] Provider Note   CSN: [id number] Arrival date & time: 08/01/16  1853  By signing my name below, I, Aaron [name], attest that this documentation has been prepared under the direction and in the presence of Kerrie Buffalo, NP Electronically Signed: Valentino [name], ED Scribe. 08/01/16. 8:11 PM.  History   Chief Complaint Chief Complaint  Patient presents with  . Laceration   The history is provided by the patient. No language interpreter was used.    HPI Comments: Aaron [name] is a 50 y.o. male who presents to the Emergency Department complaining of sudden onset lacerations to the left forearm s/p injury that occurred today. Pt reports helping a friend cut sheet metal squares. He states they were compiling the sheet squares into a stack, when suddenly the stack tipped over to the side and striked his left forearm. He reports associated 3-4 lacerations and bleeding to the affected area. He has been able to keep the bleeding under control with a cloth. He reports no modifying factors noted. He denies numbness and tingling, fever. No additional complaints at this time. T-Dap UTD.   Past Medical History:  Diagnosis Date  . Rotator cuff injury 02/18/2013   Right side, untreated    Patient Active Problem List   Diagnosis Date Noted  . Major depression, recurrent (HCC) 02/19/2013  . PTSD (post-traumatic stress disorder) 02/19/2013  . Alcohol abuse 02/19/2013  . TOBACCO DEPENDENCE 12/08/2006  . DJD, UNSPECIFIED 12/08/2006    Past Surgical History:  Procedure Laterality Date  . HERNIA REPAIR    . MULTIPLE TOOTH EXTRACTIONS         Home Medications    Prior to Admission medications   Medication Sig Start Date End Date Taking? Authorizing Provider  HYDROcodone-acetaminophen (NORCO/VICODIN) 5-325 MG per tablet Take one-two tabs po q 4-6 hrs prn pain 12/03/13   Tammy Triplett, PA-C  naproxen (NAPROSYN) 500 MG tablet Take 1 tablet (500 mg total) by mouth 2 (two)  times daily. Take with food 12/03/13   Pauline Aus, PA-C    Family History History reviewed. No pertinent family history.  Social History Social History  Substance Use Topics  . Smoking status: Current Every Day Smoker    Packs/day: 0.50    Years: 22.00    Types: Cigarettes  . Smokeless tobacco: Never Used  . Alcohol use Yes     Comment: rarely     Allergies   Review of patient's allergies indicates no known allergies.   Review of Systems Review of Systems  Constitutional: Negative for fever.  Skin: Positive for wound (left forearm).       + bleeding  Neurological: Negative for numbness.       -tingling  all other systems negative   Physical Exam Updated Vital Signs BP 138/76 (BP Location: Right Arm)   Pulse 84   Temp 98.6 F (37 C) (Oral)   Resp 18   Ht 5\' 11"  (1.803 m)   Wt 195 lb (88.5 kg)   SpO2 98%   BMI 27.20 kg/m   Physical Exam  Constitutional: He appears well-developed and well-nourished. No distress.  HENT:  Head: Normocephalic and atraumatic.  Eyes: Conjunctivae and EOM are normal.  Neck: Neck supple.  Cardiovascular: Normal rate.   No murmur heard. Pulmonary/Chest: Effort normal.  Musculoskeletal: Normal range of motion. He exhibits no edema.       Left forearm: He exhibits tenderness and laceration.       Arms: Radial pulses 2+. Adequate circulation.  Equal grip. 5 cm laceration to left forearm ulnar aspect. Superficial laceration to dorsal left forearm.   Neurological: He is alert.  Skin: Skin is warm and dry. Capillary refill takes less than 2 seconds.  Psychiatric: He has a normal mood and affect. His behavior is normal.  Nursing note and vitals reviewed.    ED Treatments / Results   DIAGNOSTIC STUDIES: Oxygen Saturation is 98% on RA, normal by my interpretation.    COORDINATION OF CARE: 7:43 PM Discussed treatment plan with pt at bedside which includes left forearm XR and pt agreed to plan.  Radiology Dg Forearm  Left  Result Date: 08/01/2016 CLINICAL DATA:  Injury, laceration EXAM: LEFT FOREARM - 2 VIEW COMPARISON:  None. FINDINGS: No dislocation is evident. There is no radiopaque foreign body. Tendinous calcification at the olecranon. Tiny os ossific density adjacent to the coronoid process, could relate to previous injury or small osteophyte. IMPRESSION: 1. No definite radiopaque foreign body 2. Ossific density adjacent to coronoid process, possible old injury versus small osteophyte, correlate clinically for point tenderness. Electronically Signed   By: Jasmine PangKim  Fujinaga M.D.   On: 08/01/2016 19:54    Procedures .Marland Kitchen.Laceration Repair Date/Time: 08/01/2016 8:26 PM Performed by: Loren RacerYELVERTON, DAVID Authorized by: Ranae PalmsYELVERTON, DAVID   Consent:    Consent obtained:  Verbal   Consent given by:  Patient   Risks discussed:  Infection   Alternatives discussed:  No treatment Anesthesia (see MAR for exact dosages):    Anesthesia method:  Local infiltration   Local anesthetic:  Lidocaine 1% w/o epi (4cc) Laceration details:    Location:  Shoulder/arm   Shoulder/arm location:  L lower arm   Length (cm):  5 Repair type:    Repair type:  Simple Pre-procedure details:    Preparation:  Patient was prepped and draped in usual sterile fashion and imaging obtained to evaluate for foreign bodies Exploration:    Wound extent: no nerve damage noted and no tendon damage noted     Contaminated: no   Treatment:    Area cleansed with:  Betadine   Amount of cleaning:  Standard   Irrigation solution:  Sterile saline   Irrigation method:  Syringe Skin repair:    Repair method:  Steri-Strips and staples   Number of staples:  7 Approximation:    Vermilion border: well-aligned   Post-procedure details:    Dressing:  Sterile dressing and non-adherent dressing   Patient tolerance of procedure:  Tolerated well, no immediate complications Comments:     The superficial wounds to the dorsum of the left forearm were cleaned  and irrigated with NSS, steri strips and dermabond applied.     (including critical care time)  Medications Ordered in ED Medications  lidocaine (PF) (XYLOCAINE) 1 % injection 5 mL (5 mLs Infiltration Given by Other 08/01/16 2033)     Initial Impression / Assessment and Plan / ED Course  I have reviewed the triage vital signs and the nursing notes.  Pertinent  imaging results that were available during my care of the patient were reviewed by me and considered in my medical decision making (see chart for details).  Clinical Course  Tetanus UTD. Laceration occurred < 12 hours prior to repair. Discussed laceration care with pt and answered questions. Pt to f/u for staple removal in 7-10 days and wound check sooner should there be signs of dehiscence or infection. Pt is hemodynamically stable with no complaints prior to dc.    Final Clinical Impressions(s) / ED  Diagnoses   Final diagnoses:  Laceration of left forearm, initial encounter    New Prescriptions New Prescriptions   No medications on file   I personally performed the services described in this documentation, which was scribed in my presence. The recorded information has been reviewed and is accurate.      [place], [place] 08/01/16 2045    Loren Racer, MD 08/06/16 [phone]

## 2016-08-11 ENCOUNTER — Encounter (HOSPITAL_COMMUNITY): Payer: Self-pay

## 2016-08-11 ENCOUNTER — Emergency Department (HOSPITAL_COMMUNITY)
Admission: EM | Admit: 2016-08-11 | Discharge: 2016-08-11 | Disposition: A | Discharge status: Home / Self Care | Payer: Self-pay | Attending: Emergency Medicine | Admitting: Emergency Medicine

## 2016-08-11 DIAGNOSIS — F17210 Nicotine dependence, cigarettes, uncomplicated: Secondary | ICD-10-CM | POA: Insufficient documentation

## 2016-08-11 DIAGNOSIS — Z4802 Encounter for removal of sutures: Secondary | ICD-10-CM | POA: Insufficient documentation

## 2016-08-11 DIAGNOSIS — Z79899 Other long term (current) drug therapy: Secondary | ICD-10-CM | POA: Insufficient documentation

## 2016-08-11 DIAGNOSIS — T148XXA Other injury of unspecified body region, initial encounter: Secondary | ICD-10-CM

## 2016-08-11 DIAGNOSIS — L089 Local infection of the skin and subcutaneous tissue, unspecified: Secondary | ICD-10-CM

## 2016-08-11 MED ORDER — CEPHALEXIN 500 MG PO CAPS: 500.0000 mg | ORAL_CAPSULE | Freq: Four times a day (QID) | ORAL | 0 refills | Status: DC

## 2016-08-11 NOTE — ED Notes (Signed)
Staples removed 

## 2016-08-11 NOTE — ED Provider Notes (Signed)
[facility] Provider Note   CSN: [id number] Arrival date & time: 08/11/16  1619     History   Chief Complaint Chief Complaint  Patient presents with  . Suture / Staple Removal    HPI Aaron [name] is a 50 y.o. male.   Suture / Staple Removal  This is a chronic problem. The current episode started more than 2 days ago. The problem has been gradually improving. Nothing aggravates the symptoms. The symptoms are relieved by NSAIDs and acetaminophen.   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 Endorses mild redness and warmth of site.  Past Medical History:  Diagnosis Date  . Rotator cuff injury 02/18/2013   Right side, untreated    Patient Active Problem List   Diagnosis Date Noted  . Major depression, recurrent (HCC) 02/19/2013  . PTSD (post-traumatic stress disorder) 02/19/2013  . Alcohol abuse 02/19/2013  . TOBACCO DEPENDENCE 12/08/2006  . DJD, UNSPECIFIED 12/08/2006    Past Surgical History:  Procedure Laterality Date  . HERNIA REPAIR    . MULTIPLE TOOTH EXTRACTIONS         Home Medications    Prior to Admission medications   Medication Sig Start Date End Date Taking? Authorizing Provider  cephALEXin (KEFLEX) 500 MG capsule Take 1 capsule (500 mg total) by mouth 4 (four) times daily. 08/11/16   Rise MuKenneth T Abir Eroh, PA-C  HYDROcodone-acetaminophen (NORCO/VICODIN) 5-325 MG per tablet Take one-two tabs po q 4-6 hrs prn pain 12/03/13   Tammy Triplett, PA-C  naproxen (NAPROSYN) 500 MG tablet Take 1 tablet (500 mg total) by mouth 2 (two) times daily. Take with food 12/03/13   Tammy Triplett, PA-C    Family History No family history on file.  Social History Social History  Substance Use Topics  . Smoking status: Current Every Day Smoker    Packs/day: 0.50    Years: 22.00    Types: Cigarettes  . Smokeless tobacco: Never Used  . Alcohol use Yes     Comment: rarely     Allergies   Review of patient's  allergies indicates no known allergies.   Review of Systems Review of Systems  Constitutional: Negative for chills and fever.  Musculoskeletal: Negative.   Skin: Positive for color change and wound.  All other systems reviewed and are negative.    Physical Exam Updated Vital Signs BP 152/68   Pulse 61   Temp 98.2 F (36.8 C) (Oral)   Resp 16   Ht 5\' 11"  (1.803 m)   Wt 81.6 kg   SpO2 100%   BMI 25.10 kg/m   Physical Exam  Constitutional: He appears well-developed and well-nourished. No distress.  Eyes: Right eye exhibits no discharge. Left eye exhibits no discharge. No scleral icterus.  Cardiovascular:  Pulses:      Radial pulses are 2+ on the right side, and 2+ on the left side.  Pulmonary/Chest: No respiratory distress.  Musculoskeletal: Normal range of motion.  Neurological: He is alert.  Skin: Capillary refill takes less than 2 seconds. No pallor.  Well approx wound to left forearm that is healing with staples. There is small area of erythema and edema noted around the wound. Patient sates there is some tenderness with palpation. No purulent discharge. Warmth noted. Wound has grease and dirt. Radial pulses are 2+. Sensation intact. Cap refill normal.  Nursing note and vitals reviewed.    ED Treatments / Results  Labs (all labs ordered are listed,  but only abnormal results are displayed) Labs Reviewed - No data to display  EKG  EKG Interpretation None       Radiology No results found.  Procedures Procedures (including critical care time)  Medications Ordered in ED Medications - No data to display   Initial Impression / Assessment and Plan / ED Course  I have reviewed the triage vital signs and the nursing notes.  Pertinent labs & imaging results that were available during my care of the patient were reviewed by me and considered in my medical decision making (see chart for details).  Clinical Course  Staple removal   Pt to ER for staple/suture  removal and wound check as above. Procedure tolerated well. Vitals normal. 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 Low suspicion for MRSA. Scar minimization & return precautions given at dc including fevers, worsening erythema, edema, or tenderness. Encouraged follow up with pcp for wound recheck in 5-6 days. Patient asked for narcotic pain medicine informed patient I did not feel comfortable and he needs to use NSAIDS or tylenol.  Final Clinical Impressions(s) / ED Diagnoses   Final diagnoses:  Removal of staples  Encounter for staple removal  Wound infection    New Prescriptions New Prescriptions   CEPHALEXIN (KEFLEX) 500 MG CAPSULE    Take 1 capsule (500 mg total) by mouth 4 (four) times daily.     Rise MuKenneth T Cylan Borum, PA-C 08/12/16 1713    Jacalyn LefevreJulie Haviland, MD 08/12/16 [phone]

## 2016-08-11 NOTE — Discharge Instructions (Signed)
Please take the antibiotics 4 times a day for 6 days. Continue to keep the wound clean with soap and water. You may apply antibiotic ointment. Please follow-up with her primary care doctor in the next week for wound recheck. If he develops fevers, worsening pain, worsening redness please return to the ED.

## 2016-08-11 NOTE — ED Triage Notes (Signed)
Here for staple removal. Denies any complaints. Edges well approximated. Small amount of redness noted.

## 2020-04-23 ENCOUNTER — Emergency Department (HOSPITAL_COMMUNITY): Payer: BC Managed Care – PPO

## 2020-04-23 ENCOUNTER — Encounter (HOSPITAL_COMMUNITY): Payer: Self-pay | Admitting: Emergency Medicine

## 2020-04-23 ENCOUNTER — Emergency Department (HOSPITAL_COMMUNITY)
Admission: EM | Admit: 2020-04-23 | Discharge: 2020-04-23 | Disposition: A | Discharge status: Home / Self Care | Payer: BC Managed Care – PPO | Attending: Emergency Medicine | Admitting: Emergency Medicine

## 2020-04-23 ENCOUNTER — Other Ambulatory Visit: Payer: Self-pay

## 2020-04-23 DIAGNOSIS — G5622 Lesion of ulnar nerve, left upper limb: Secondary | ICD-10-CM

## 2020-04-23 DIAGNOSIS — F17210 Nicotine dependence, cigarettes, uncomplicated: Secondary | ICD-10-CM | POA: Insufficient documentation

## 2020-04-23 DIAGNOSIS — R197 Diarrhea, unspecified: Secondary | ICD-10-CM | POA: Diagnosis not present

## 2020-04-23 DIAGNOSIS — I10 Essential (primary) hypertension: Secondary | ICD-10-CM | POA: Insufficient documentation

## 2020-04-23 DIAGNOSIS — R0602 Shortness of breath: Secondary | ICD-10-CM | POA: Diagnosis present

## 2020-04-23 DIAGNOSIS — Z7982 Long term (current) use of aspirin: Secondary | ICD-10-CM | POA: Diagnosis not present

## 2020-04-23 DIAGNOSIS — J209 Acute bronchitis, unspecified: Secondary | ICD-10-CM

## 2020-04-23 DIAGNOSIS — Z20822 Contact with and (suspected) exposure to covid-19: Secondary | ICD-10-CM | POA: Insufficient documentation

## 2020-04-23 DIAGNOSIS — R079 Chest pain, unspecified: Secondary | ICD-10-CM

## 2020-04-23 DIAGNOSIS — Z79899 Other long term (current) drug therapy: Secondary | ICD-10-CM | POA: Insufficient documentation

## 2020-04-23 HISTORY — DX: Essential (primary) hypertension: I10

## 2020-04-23 HISTORY — DX: Anxiety disorder, unspecified: F41.9

## 2020-04-23 LAB — POC SARS CORONAVIRUS 2 AG -  ED: SARS Coronavirus 2 Ag: NEGATIVE

## 2020-04-23 MED ORDER — ALBUTEROL SULFATE HFA 108 (90 BASE) MCG/ACT IN AERS
4.0000 | INHALATION_SPRAY | Freq: Once | RESPIRATORY_TRACT | Status: AC
  Administered 2020-04-23: 4 via RESPIRATORY_TRACT
  Filled 2020-04-23: qty 6.7

## 2020-04-23 MED ORDER — AZITHROMYCIN 250 MG PO TABS: 250.0000 mg | ORAL_TABLET | Freq: Every day | ORAL | 0 refills | Status: DC

## 2020-04-23 NOTE — Discharge Instructions (Addendum)
You may use the albuterol inhaler 2 puffs every 4 hours as needed for cough or shortness of breath.

## 2020-04-23 NOTE — ED Provider Notes (Signed)
[facility] EMERGENCY DEPARTMENT Provider Note   CSN: [id number] Arrival date & time: 04/23/20  1527     History Chief Complaint  Patient presents with   Shortness of Breath    Aaron [name] is a 54 y.o. male.  HPI 54 year old male presents with cough.  He states he has been coughing for the last 2 or 3 days.  Occasionally some yellow sputum.  He has also had a fever up to 102 yesterday.  He states he received the [employer] vaccine just over 2 weeks ago.  He is chest has been sore from coughing and he feels short of breath at times.  He has had some wheezing.  He is a current smoker.  No vomiting but had a couple episodes of diarrhea.  Denies any known history of asthma/COPD but states he has used an albuterol inhaler before and gets bronchitis about once a year.  He also notes left ulnar wrist swelling with concomitant left pinky and partial ring finger numbness.  It is more of a tingling sensation.  No injury or pain to the left wrist.  He is left-handed and feels like his hand is a little weaker.   Past Medical History:  Diagnosis Date   Anxiety    Hypertension    Rotator cuff injury 02/18/2013   Right side, untreated    Patient Active Problem List   Diagnosis Date Noted   Major depression, recurrent (HCC) 02/19/2013   PTSD (post-traumatic stress disorder) 02/19/2013   Alcohol abuse 02/19/2013   TOBACCO DEPENDENCE 12/08/2006   DJD, UNSPECIFIED 12/08/2006    Past Surgical History:  Procedure Laterality Date   HERNIA REPAIR     MULTIPLE TOOTH EXTRACTIONS         No family history on file.  Social History   Tobacco Use   Smoking status: Current Every Day Smoker    Packs/day: 1.00    Years: 22.00    Pack years: 22.00    Types: Cigarettes   Smokeless tobacco: Never Used  Vaping Use   Vaping Use: Never used  Substance Use Topics   Alcohol use: Yes    Comment: rarely   Drug use: No    Types: Marijuana    Comment: quit 2 months ago  04/23/20    Home Medications Prior to Admission medications   Medication Sig Start Date End Date Taking? Authorizing Provider  acetaminophen (TYLENOL) 500 MG tablet Take 500 mg by mouth every 6 (six) hours as needed for mild pain or moderate pain.   Yes [provider]  Phenyleph-CPM-DM-APAP (ALKA-SELTZER PLUS COLD & COUGH) 02-09-09-325 MG CAPS Take 2 capsules by mouth daily as needed (cold/cough symptoms).   Yes [provider]  Phenyleph-Doxyl-DM-Aspirin (ALKA-SELTZER PLUS NIGHT COLD PO) Take 2 capsules by mouth at bedtime as needed (for cold/cough symptoms).   Yes [provider]  azithromycin (ZITHROMAX) 250 MG tablet Take 1 tablet (250 mg total) by mouth daily. Take first 2 tablets together, then 1 every day until finished. 04/23/20   Pricilla Loveless, MD    Allergies    Patient has no known allergies.  Review of Systems   Review of Systems  Constitutional: Positive for fever.  Respiratory: Positive for cough, shortness of breath and wheezing.   Cardiovascular: Positive for chest pain (with coughing).  Gastrointestinal: Positive for diarrhea (x3). Negative for vomiting.  All other systems reviewed and are negative.   Physical Exam Updated Vital Signs BP (!) 138/93    Pulse  79    Temp 98.8 F (37.1 C) (Oral)    Resp 16    Ht 5\' 10"  (1.778 m)    Wt 86.2 kg    SpO2 94%    BMI 27.26 kg/m   Physical Exam Vitals and nursing note reviewed.  Constitutional:      Appearance: He is well-developed.  HENT:     Head: Normocephalic and atraumatic.     Right Ear: External ear normal.     Left Ear: External ear normal.     Nose: Nose normal.  Eyes:     General:        Right eye: No discharge.        Left eye: No discharge.  Cardiovascular:     Rate and Rhythm: Normal rate and regular rhythm.     Heart sounds: Normal heart sounds.  Pulmonary:     Effort: Pulmonary effort is normal. No tachypnea, accessory muscle usage or respiratory distress.     Breath  sounds: Wheezing (diffuse, expiratory) present.  Abdominal:     Palpations: Abdomen is soft.     Tenderness: There is no abdominal tenderness.  Musculoskeletal:     Left wrist: Swelling (mild, ulnar) present. No tenderness. Normal range of motion.     Cervical back: Neck supple.     Comments: Normal motor sensation and radial, ulnar, nerve testing save for subjective decreased sensation to left pinky  Skin:    General: Skin is warm and dry.  Neurological:     Mental Status: He is alert.  Psychiatric:        Mood and Affect: Mood is not anxious.     ED Results / Procedures / Treatments   Labs (all labs ordered are listed, but only abnormal results are displayed) Labs Reviewed  POC SARS CORONAVIRUS 2 AG -  ED    EKG EKG Interpretation  Date/Time:  Wednesday April 23 2020 15:58:20 EDT Ventricular Rate:  76 PR Interval:  122 QRS Duration: 80 QT Interval:  352 QTC Calculation: 396 R Axis:   85 Text Interpretation: Normal sinus rhythm no acute ST/T changes similar to 2007 Confirmed by 2008 [phone]) on 04/23/2020 4:05:20 PM   Radiology DG Wrist Complete Left  Result Date: 04/23/2020 CLINICAL DATA:  Left lateral wrist swelling EXAM: LEFT WRIST - COMPLETE 3+ VIEW COMPARISON:  None. FINDINGS: There is no evidence of fracture or dislocation. There is no evidence of arthropathy or other focal bone abnormality. Soft tissues are unremarkable. IMPRESSION: Negative. Electronically Signed   By: 04/25/2020 MD   On: 04/23/2020 20:01   DG Chest Port 1 View  Result Date: 04/23/2020 CLINICAL DATA:  54 year old male with chest pain. EXAM: PORTABLE CHEST 1 VIEW COMPARISON:  Chest radiograph dated 12/10/2019. FINDINGS: No focal consolidation, pleural effusion, or pneumothorax. The cardiac silhouette is within limits. No acute osseous pathology. IMPRESSION: No active disease. Electronically Signed   By: 02/09/2020 M.D.   On: 04/23/2020 19:31    Procedures Procedures (including  critical care time)  Medications Ordered in ED Medications  albuterol (VENTOLIN HFA) 108 (90 Base) MCG/ACT inhaler 4 puff (4 puffs Inhalation Given 04/23/20 1946)    ED Course  I have reviewed the triage vital signs and the nursing notes.  Pertinent labs & imaging results that were available during my care of the patient were reviewed by me and considered in my medical decision making (see chart for details).    MDM Rules/Calculators/A&P  Patient's presentation is most consistent with an acute bronchitis.  Given his high fever, I think is reasonable to treat him with azithromycin.  He is feeling a little better after albuterol.  He is not hypoxic or in respiratory distress.  He also notes this ulnar nerve neuropathy for a couple weeks with some left wrist swelling.  X-ray obtained and personally reviewed.  I will refer him to orthopedics.  I do not appreciate any significant weakness on exam.  Point-of-care Covid testing is negative.  I discussed that this is not enough to fully rule it out but is more of a rapid test.  However he declines further testing/PCR testing.  Aaron [name] was evaluated in Emergency Department on 04/23/2020 for the symptoms described in the history of present illness. He was evaluated in the context of the global COVID-19 pandemic, which necessitated consideration that the patient might be at risk for infection with the SARS-CoV-2 virus that causes COVID-19. Institutional protocols and algorithms that pertain to the evaluation of patients at risk for COVID-19 are in a state of rapid change based on information released by regulatory bodies including the CDC and federal and state organizations. These policies and algorithms were followed during the patient's care in the ED.  Final Clinical Impression(s) / ED Diagnoses Final diagnoses:  Acute bronchitis, unspecified organism  Ulnar neuropathy at wrist, left    Rx / DC Orders ED Discharge Orders          Ordered    azithromycin (ZITHROMAX) 250 MG tablet  Daily     Discontinue  Reprint     04/23/20 2100           Pricilla Loveless, MD 04/23/20 2356

## 2020-04-23 NOTE — ED Triage Notes (Signed)
Pt c/o flu like symptoms for the past 3 days with fever of 101. Pt endorses nausea, diarrhea, and headache. Pt has a non productive cough and has shortness of breathe.

## 2021-11-23 ENCOUNTER — Other Ambulatory Visit: Payer: Self-pay

## 2021-11-23 ENCOUNTER — Encounter (HOSPITAL_COMMUNITY): Payer: Self-pay | Admitting: *Deleted

## 2021-11-23 ENCOUNTER — Emergency Department (HOSPITAL_COMMUNITY)
Admission: EM | Admit: 2021-11-23 | Discharge: 2021-11-23 | Disposition: A | Discharge status: Home / Self Care | Payer: Commercial Managed Care - PPO | Attending: Emergency Medicine | Admitting: Emergency Medicine

## 2021-11-23 ENCOUNTER — Emergency Department (HOSPITAL_COMMUNITY): Payer: Commercial Managed Care - PPO

## 2021-11-23 DIAGNOSIS — R1032 Left lower quadrant pain: Secondary | ICD-10-CM

## 2021-11-23 DIAGNOSIS — R1031 Right lower quadrant pain: Secondary | ICD-10-CM | POA: Insufficient documentation

## 2021-11-23 LAB — COMPREHENSIVE METABOLIC PANEL
ALT: 35 U/L (ref 0–44)
AST: 27 U/L (ref 15–41)
Albumin: 4.2 g/dL (ref 3.5–5.0)
Alkaline Phosphatase: 39 U/L (ref 38–126)
Anion gap: 8 (ref 5–15)
BUN: 12 mg/dL (ref 6–20)
CO2: 30 mmol/L (ref 22–32)
Calcium: 9.3 mg/dL (ref 8.9–10.3)
Chloride: 102 mmol/L (ref 98–111)
Creatinine, Ser: 0.99 mg/dL (ref 0.61–1.24)
GFR, Estimated: >60 mL/min (ref >60)
Glucose, Bld: 84 mg/dL (ref 70–99)
Potassium: 4 mmol/L (ref 3.5–5.1)
Sodium: 140 mmol/L (ref 135–145)
Total Bilirubin: 0.6 mg/dL (ref 0.3–1.2)
Total Protein: 7.2 g/dL (ref 6.5–8.1)

## 2021-11-23 LAB — CBC
HCT: 42.9 % (ref 39.0–52.0)
Hemoglobin: 15 g/dL (ref 13.0–17.0)
MCH: 33.3 pg (ref 26.0–34.0)
MCHC: 35 g/dL (ref 30.0–36.0)
MCV: 95.1 fL (ref 80.0–100.0)
Platelets: 240 10*3/uL (ref 150–400)
RBC: 4.51 MIL/uL (ref 4.22–5.81)
RDW: 12.3 % (ref 11.5–15.5)
WBC: 8 10*3/uL (ref 4.0–10.5)
nRBC: 0 % (ref 0.0–0.2)

## 2021-11-23 LAB — URINALYSIS, ROUTINE W REFLEX MICROSCOPIC
Bilirubin Urine: NEGATIVE
Glucose, UA: NEGATIVE mg/dL
Hgb urine dipstick: NEGATIVE
Ketones, ur: NEGATIVE mg/dL
Leukocytes,Ua: NEGATIVE
Nitrite: NEGATIVE
Protein, ur: NEGATIVE mg/dL
Specific Gravity, Urine: 1.015 (ref 1.005–1.030)
pH: 6 (ref 5.0–8.0)

## 2021-11-23 LAB — LIPASE, BLOOD: Lipase: 36 U/L (ref 11–51)

## 2021-11-23 MED ORDER — HYDROCODONE-ACETAMINOPHEN 5-325 MG PO TABS
1.0000 | ORAL_TABLET | Freq: Four times a day (QID) | ORAL | 0 refills | Status: DC | PRN
Start: 2021-11-23 — End: 2023-02-03

## 2021-11-23 NOTE — ED Provider Notes (Signed)
[facility] EMERGENCY DEPARTMENT Provider Note   CSN: [id number] Arrival date & time: 11/23/21  1521     History  Chief Complaint  Patient presents with   Abdominal Pain    Aaron [name] is a 56 y.o. male.  Patient complains of left inguinal pain and testicular swelling and pain occasionally.  No past medical history  The history is provided by the patient and medical records.  Abdominal Pain Pain location:  RLQ Pain quality: aching   Pain radiates to:  Does not radiate Pain severity:  Mild Onset quality:  Sudden Timing:  Constant Chronicity:  New Context: not alcohol use   Relieved by:  Nothing Worsened by:  Nothing Associated symptoms: no chest pain, no cough, no diarrhea, no fatigue and no hematuria       Home Medications Prior to Admission medications   Medication Sig Start Date End Date Taking? Authorizing Provider  HYDROcodone-acetaminophen (NORCO/VICODIN) 5-325 MG tablet Take 1 tablet by mouth every 6 (six) hours as needed. 11/23/21  Yes Bethann Berkshire, MD  acetaminophen (TYLENOL) 500 MG tablet Take 500 mg by mouth every 6 (six) hours as needed for mild pain or moderate pain.    [provider]  azithromycin (ZITHROMAX) 250 MG tablet Take 1 tablet (250 mg total) by mouth daily. Take first 2 tablets together, then 1 every day until finished. 04/23/20   Pricilla Loveless, MD  Phenyleph-CPM-DM-APAP (ALKA-SELTZER PLUS COLD & COUGH) 02-09-09-325 MG CAPS Take 2 capsules by mouth daily as needed (cold/cough symptoms).    [provider]  Phenyleph-Doxyl-DM-Aspirin (ALKA-SELTZER PLUS NIGHT COLD PO) Take 2 capsules by mouth at bedtime as needed (for cold/cough symptoms).    [provider]      Allergies    Patient has no known allergies.    Review of Systems   Review of Systems  Constitutional:  Negative for appetite change and fatigue.  HENT:  Negative for congestion, ear discharge and sinus pressure.   Eyes:  Negative for discharge.   Respiratory:  Negative for cough.   Cardiovascular:  Negative for chest pain.  Gastrointestinal:  Positive for abdominal pain. Negative for diarrhea.  Genitourinary:  Negative for frequency and hematuria.  Musculoskeletal:  Negative for back pain.  Skin:  Negative for rash.  Neurological:  Negative for seizures and headaches.  Psychiatric/Behavioral:  Negative for hallucinations.    Physical Exam Updated Vital Signs BP (!) 142/76    Pulse 65    Temp 98.4 F (36.9 C) (Oral)    Resp 17    SpO2 98%  Physical Exam Vitals and nursing note reviewed.  Constitutional:      Appearance: He is well-developed.  HENT:     Head: Normocephalic.     Nose: Nose normal.  Eyes:     General: No scleral icterus.    Conjunctiva/sclera: Conjunctivae normal.  Neck:     Thyroid: No thyromegaly.  Cardiovascular:     Rate and Rhythm: Normal rate and regular rhythm.     Heart sounds: No murmur heard.   No friction rub. No gallop.  Pulmonary:     Breath sounds: No stridor. No wheezing or rales.  Chest:     Chest wall: No tenderness.  Abdominal:     General: There is no distension.     Tenderness: There is no abdominal tenderness. There is no rebound.     Comments: Tenderness to right groin no obvious hernia  Musculoskeletal:        General: Normal  range of motion.     Cervical back: Neck supple.     Comments: Tenderness to right groin  Lymphadenopathy:     Cervical: No cervical adenopathy.  Skin:    Findings: No erythema or rash.  Neurological:     Mental Status: He is alert and oriented to person, place, and time.     Motor: No abnormal muscle tone.     Coordination: Coordination normal.  Psychiatric:        Behavior: Behavior normal.    ED Results / Procedures / Treatments   Labs (all labs ordered are listed, but only abnormal results are displayed) Labs Reviewed  LIPASE, BLOOD  COMPREHENSIVE METABOLIC PANEL  CBC  URINALYSIS, ROUTINE W REFLEX MICROSCOPIC    EKG EKG  Interpretation  Date/Time:  Monday November 23 2021 15:44:07 EST Ventricular Rate:  83 PR Interval:  118 QRS Duration: 80 QT Interval:  356 QTC Calculation: 418 R Axis:   76 Text Interpretation: Normal sinus rhythm Normal ECG When compared with ECG of 23-Apr-2020 15:58, No significant change was found Confirmed by Milton Ferguson [phone]) on 11/23/2021 8:58:06 PM  Radiology CT Renal Stone Study  Result Date: 11/23/2021 CLINICAL DATA:  Left-sided flank pain for 4 days, nephrolithiasis EXAM: CT ABDOMEN AND PELVIS WITHOUT CONTRAST TECHNIQUE: Multidetector CT imaging of the abdomen and pelvis was performed following the standard protocol without IV contrast. Unenhanced CT was performed per clinician order. Lack of IV contrast limits sensitivity and specificity, especially for evaluation of abdominal/pelvic solid viscera. RADIATION DOSE REDUCTION: This exam was performed according to the departmental dose-optimization program which includes automated exposure control, adjustment of the mA and/or kV according to patient size and/or use of iterative reconstruction technique. COMPARISON:  None. FINDINGS: Lower chest: No acute pleural or parenchymal lung disease. Hepatobiliary: Unremarkable unenhanced appearance of the liver and gallbladder. Pancreas: Unremarkable unenhanced appearance. Spleen: Unremarkable unenhanced appearance. Adrenals/Urinary Tract: Horseshoe configuration of the kidneys. No evidence of urinary tract calculi or obstructive uropathy. The adrenals are unremarkable. The bladder is decompressed, limiting its evaluation. Stomach/Bowel: No bowel obstruction or ileus. Normal appendix right lower quadrant. Scattered diverticulosis of the descending and sigmoid colon without diverticulitis. No bowel wall thickening or inflammatory change. Vascular/Lymphatic: Aortic atherosclerosis. No enlarged abdominal or pelvic lymph nodes. Reproductive: Prostate is unremarkable. Other: No free fluid or free gas.  No  abdominal wall hernia. Musculoskeletal: No acute or destructive bony lesions. Reconstructed images demonstrate no additional findings. IMPRESSION: 1. No evidence of urinary tract calculi or obstructive uropathy. 2. Horseshoe kidney. 3. Distal colonic diverticulosis without diverticulitis. 4.  Aortic Atherosclerosis (ICD10-I70.0). Electronically Signed   By: Randa Ngo M.D.   On: 11/23/2021 18:29    Procedures Procedures    Medications Ordered in ED Medications - No data to display  ED Course/ Medical Decision Making/ A&P                           Medical Decision Making Amount and/or Complexity of Data Reviewed Labs: ordered. Radiology: ordered.  Risk Prescription drug management.   Patient with inguinal pain and a horseshoe kidney.  Suspect inguinal strain.  Patient also complains of swelling to his testicle.  He will be followed up with urology    This patient presents to the ED for concern of groin pain, this involves an extensive number of treatment options, and is a complaint that carries with it a high risk of complications and morbidity.  The differential diagnosis includes  kidney stone, hernia, groin strain, appendicitis   Co morbidities that complicate the patient evaluation  No comorbidities   Additional history obtained:  Additional history obtained from patient External records from outside source obtained and reviewed including hospital record   Lab Tests:  I Ordered, and personally interpreted labs.  The pertinent results include: Urine CBC and chemistries which were all unremarkable   Imaging Studies ordered:  I ordered imaging studies including CT abdomen I independently visualized and interpreted imaging which showed shows horseshoe kidney I agree with the radiologist interpretation   Cardiac Monitoring:  The patient was maintained on a cardiac monitor.  I personally viewed and interpreted the cardiac monitored which showed an underlying  rhythm of: Normal sinus rhythm   Medicines ordered and prescription drug management: No medicines given  Test Considered:  None   Critical Interventions:   none   Consultations Obtained:  No consult   Problem List / ED Course:  Abdominal pain which is probably from an inguinal strain and horseshoe kidney  Reevaluation:  After the interventions noted above, I reevaluated the patient and found that they have :stayed the same   Social Determinants of Health:  None   Dispostion:  After consideration of the diagnostic results and the patients response to treatment, I feel that the patent would benefit from follow-up with urology for horseshoe kidney and inguinal pain.         Final Clinical Impression(s) / ED Diagnoses Final diagnoses:  Left inguinal pain    Rx / DC Orders ED Discharge Orders          Ordered    HYDROcodone-acetaminophen (NORCO/VICODIN) 5-325 MG tablet  Every 6 hours PRN        11/23/21 2059              Milton Ferguson, MD 11/27/21 1050

## 2021-11-23 NOTE — Discharge Instructions (Addendum)
Follow-up with Dr. Ronne Binning in the next week or 2 for recheck

## 2021-11-23 NOTE — ED Notes (Signed)
Patient attempt to use the bathroom was unsuccessful. Patient given something to drink.

## 2021-11-23 NOTE — ED Triage Notes (Signed)
Left lower quadrant pain x2 days

## 2022-03-23 IMAGING — CT CT RENAL STONE PROTOCOL
2 of 4 series · 16 of 46 positions shown, 18 images · non-contrast
Comparison: None.

CLINICAL DATA: Left-sided flank pain for 4 days, nephrolithiasis

EXAM:
CT ABDOMEN AND PELVIS WITHOUT CONTRAST
TECHNIQUE: Multidetector CT imaging of the abdomen and pelvis was performed
following the standard protocol without IV contrast. Unenhanced CT
was performed per clinician order. Lack of IV contrast limits
sensitivity and specificity, especially for evaluation of
abdominal/pelvic solid viscera.
RADIATION DOSE REDUCTION: This exam was performed according to the
departmental dose-optimization program which includes automated
exposure control, adjustment of the mA and/or kV according to
patient size and/or use of iterative reconstruction technique.

[Series 2: axial st · axial · 0.96mm/px · z∈[-293,+132]mm · 13 of 97 slices shown, 15 images]
[im 6/97  soft-tissue]
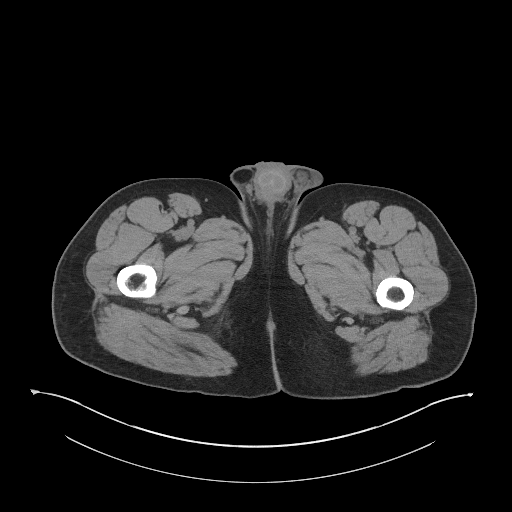
[im 6/97  bone]
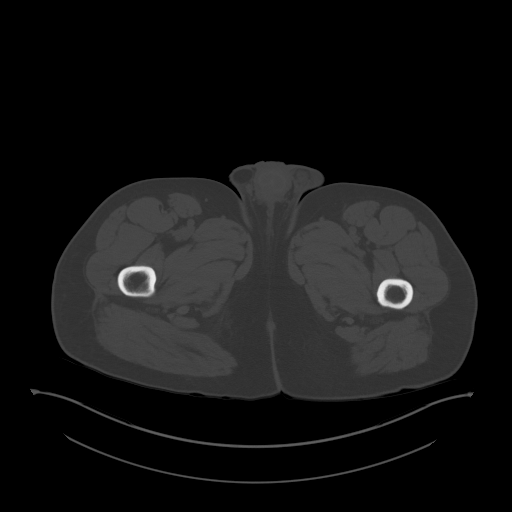
[im 11/97  soft-tissue]
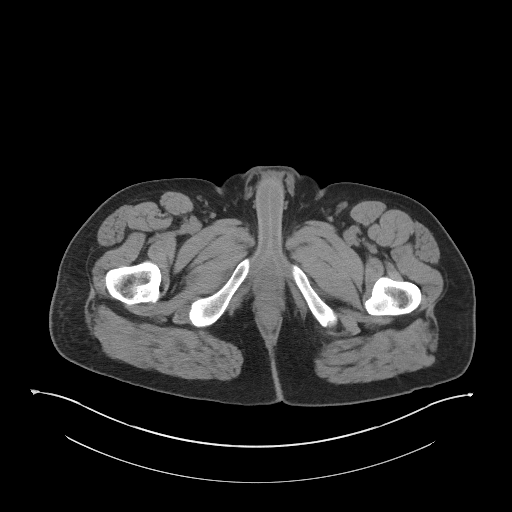
[im 22/97  soft-tissue]
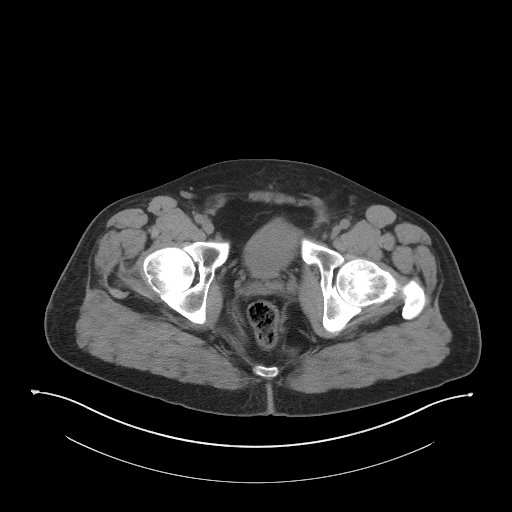
[im 27/97  soft-tissue]
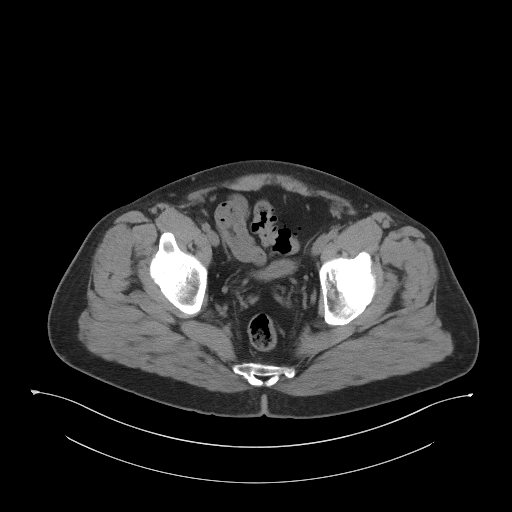
[im 33/97  soft-tissue]
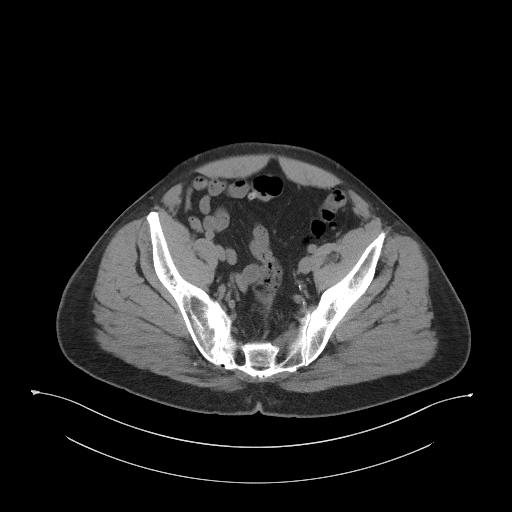
[im 43/97  soft-tissue]
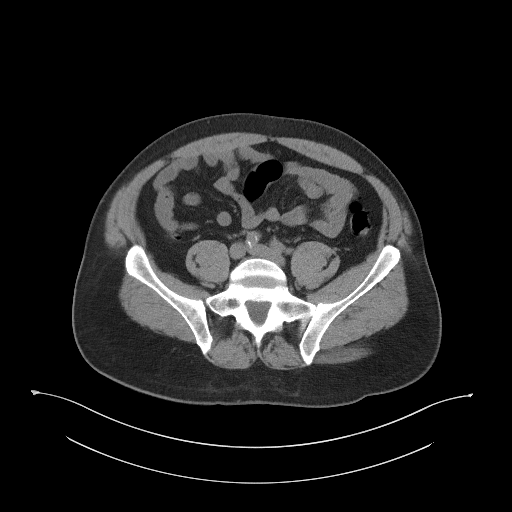
[im 49/97  soft-tissue]
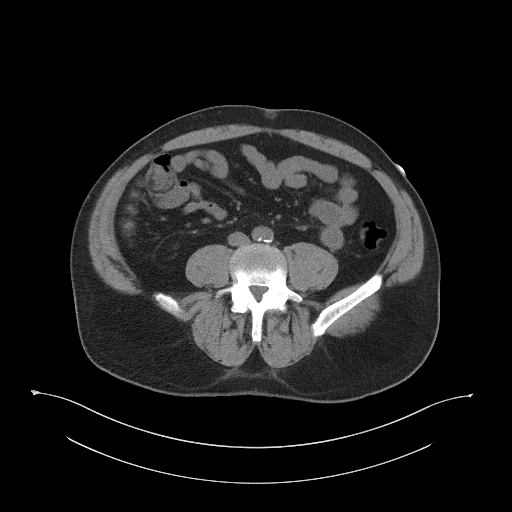
[im 54/97  soft-tissue]
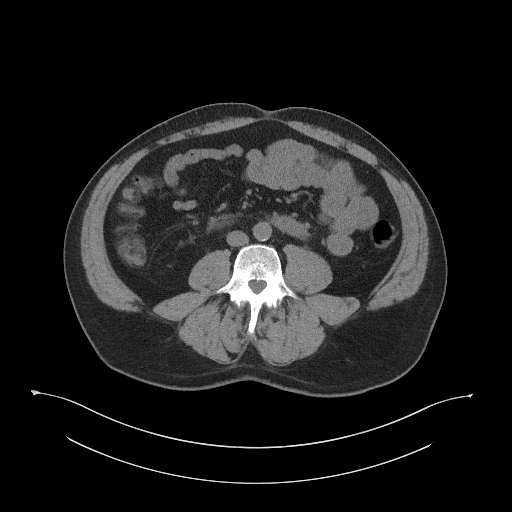
[im 65/97  soft-tissue]
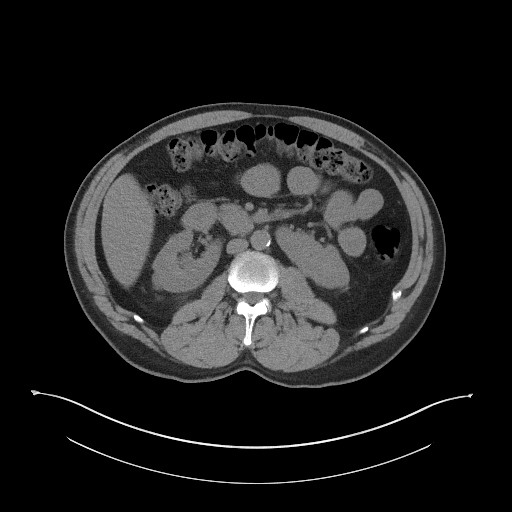
[im 65/97  bone]
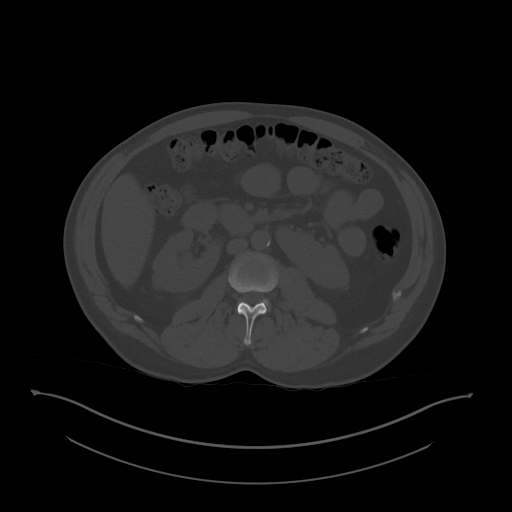
[im 70/97  soft-tissue]
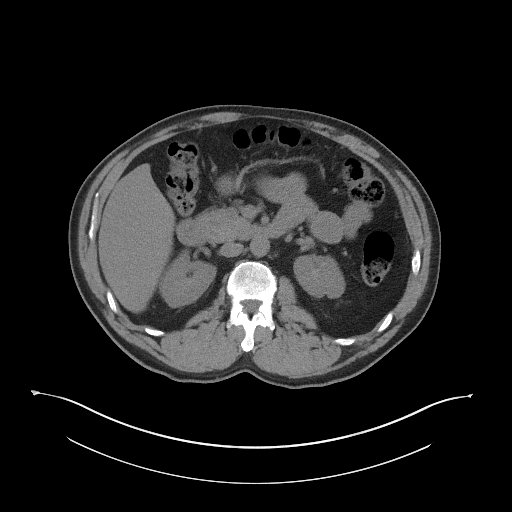
[im 75/97  soft-tissue]
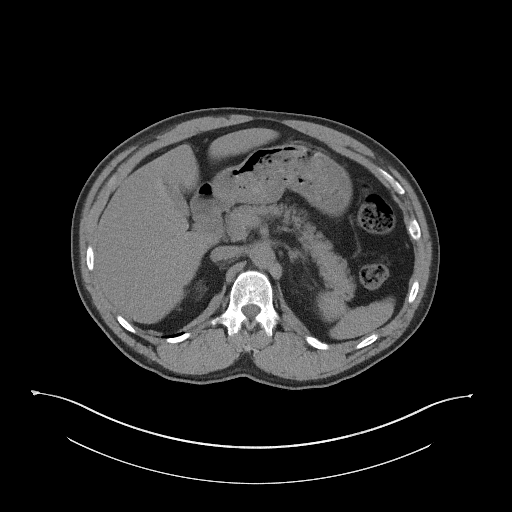
[im 86/97  soft-tissue]
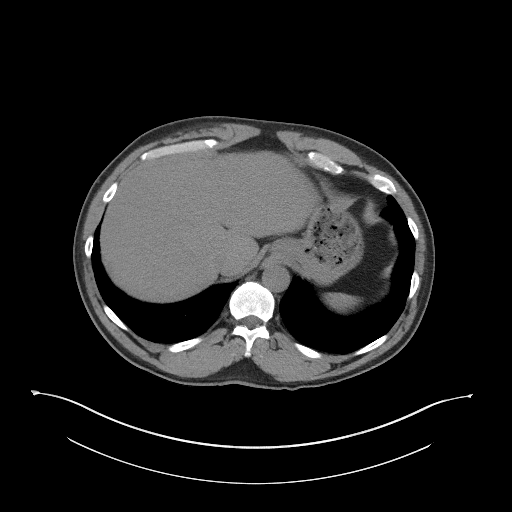
[im 91/97  soft-tissue]
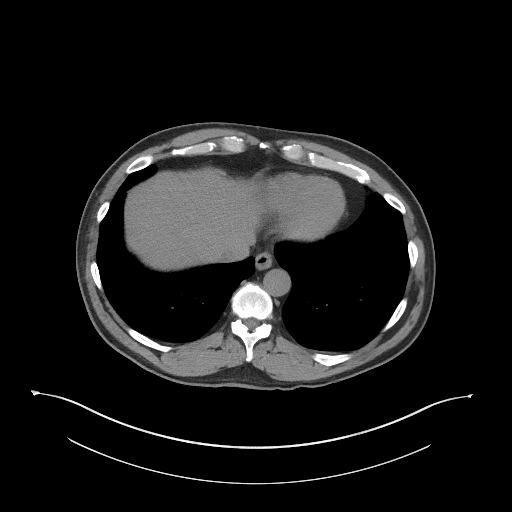

[Series 5: coronal st · coronal · 0.81mm/px · 3 of 110 slices shown]
[im 37/110  soft-tissue]
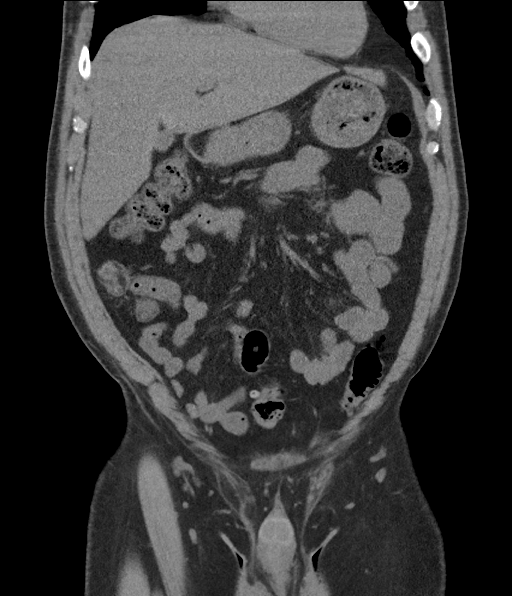
[im 49/110  soft-tissue]
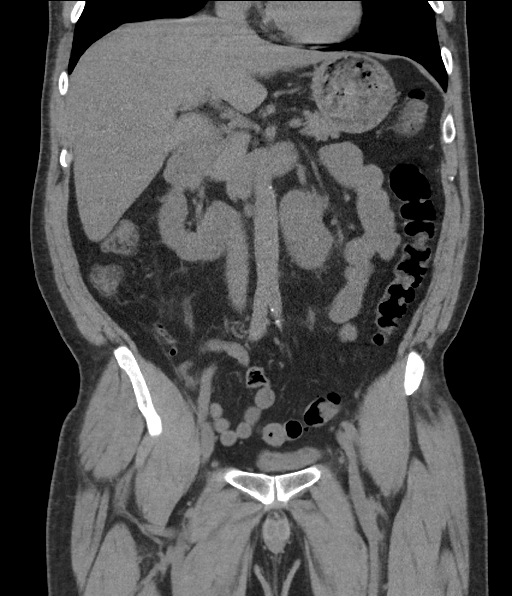
[im 61/110  soft-tissue]
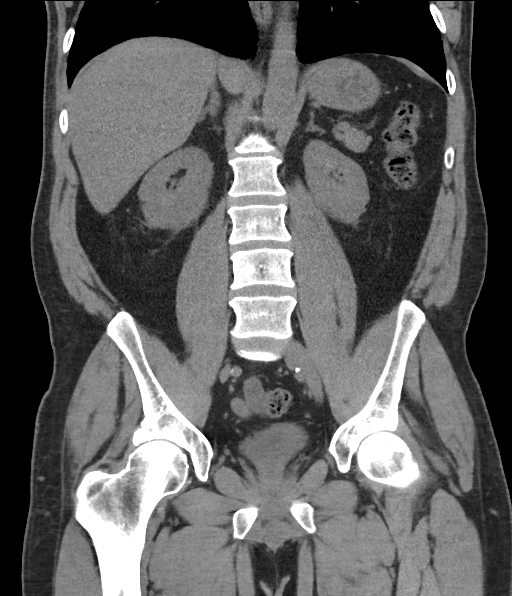

[16 of 46 positions shown; findings below may reference images not displayed]

FINDINGS: Lower chest: No acute pleural or parenchymal lung disease.

Hepatobiliary: Unremarkable unenhanced appearance of the liver and
gallbladder.

Pancreas: Unremarkable unenhanced appearance.

Spleen: Unremarkable unenhanced appearance.

Adrenals/Urinary Tract: Horseshoe configuration of the kidneys. No
evidence of urinary tract calculi or obstructive uropathy. The
adrenals are unremarkable. The bladder is decompressed, limiting its
evaluation.

Stomach/Bowel: No bowel obstruction or ileus. Normal appendix right
lower quadrant. Scattered diverticulosis of the descending and
sigmoid colon without diverticulitis. No bowel wall thickening or
inflammatory change.

Vascular/Lymphatic: Aortic atherosclerosis. No enlarged abdominal or
pelvic lymph nodes.

Reproductive: Prostate is unremarkable.

Other: No free fluid or free gas.  No abdominal wall hernia.

Musculoskeletal: No acute or destructive bony lesions. Reconstructed
images demonstrate no additional findings.
IMPRESSION: 1. No evidence of urinary tract calculi or obstructive uropathy.
2. Horseshoe kidney.
3. Distal colonic diverticulosis without diverticulitis.
4.  Aortic Atherosclerosis ([id number]).

## 2022-07-16 ENCOUNTER — Ambulatory Visit (INDEPENDENT_AMBULATORY_CARE_PROVIDER_SITE_OTHER): Payer: Self-pay

## 2022-07-16 ENCOUNTER — Ambulatory Visit (HOSPITAL_COMMUNITY)
Admission: EM | Admit: 2022-07-16 | Discharge: 2022-07-16 | Disposition: A | Discharge status: Home / Self Care | Payer: Self-pay | Attending: Nurse Practitioner | Admitting: Nurse Practitioner

## 2022-07-16 DIAGNOSIS — M79644 Pain in right finger(s): Secondary | ICD-10-CM

## 2022-07-16 MED ORDER — METHYLPREDNISOLONE 4 MG PO TBPK: ORAL_TABLET | ORAL | 0 refills | Status: AC

## 2022-07-16 NOTE — ED Provider Notes (Signed)
[facility]    CSN: [id number] Arrival date & time: 07/16/22  1016      History   Chief Complaint Chief Complaint  Patient presents with   Hand Pain    HPI Aaron [name] is a 56 y.o. male.   HPI  He is complaining of right middle finger pain and swelling that started on last night. He was getting out the shower and grabbed at the towel and heard a pop. He experienced swelling and he feels like something in his hand is moving. He has not experienced this today due to the increased swelling. He denies any previous accidents or injury. However he was completing a house move on yesterday from [place] to [place] several trips.  Past Medical History:  Diagnosis Date   Anxiety    Hypertension    Rotator cuff injury 02/18/2013   Right side, untreated    Patient Active Problem List   Diagnosis Date Noted   Major depression, recurrent (HCC) 02/19/2013   PTSD (post-traumatic stress disorder) 02/19/2013   Alcohol abuse 02/19/2013   TOBACCO DEPENDENCE 12/08/2006   DJD, UNSPECIFIED 12/08/2006    Past Surgical History:  Procedure Laterality Date   HERNIA REPAIR     MULTIPLE TOOTH EXTRACTIONS         Home Medications    Prior to Admission medications   Medication Sig Start Date End Date Taking? Authorizing Provider  acetaminophen (TYLENOL) 500 MG tablet Take 500 mg by mouth every 6 (six) hours as needed for mild pain or moderate pain.    [provider]  HYDROcodone-acetaminophen (NORCO/VICODIN) 5-325 MG tablet Take 1 tablet by mouth every 6 (six) hours as needed. 11/23/21   Bethann Berkshire, MD  Phenyleph-CPM-DM-APAP (ALKA-SELTZER PLUS COLD & COUGH) 02-09-09-325 MG CAPS Take 2 capsules by mouth daily as needed (cold/cough symptoms).    [provider]  Phenyleph-Doxyl-DM-Aspirin (ALKA-SELTZER PLUS NIGHT COLD PO) Take 2 capsules by mouth at bedtime as needed (for cold/cough symptoms).    [provider]    Family History No family history on  file.  Social History Social History   Tobacco Use   Smoking status: Every Day    Packs/day: 1.00    Years: 22.00    Total pack years: 22.00    Types: Cigarettes   Smokeless tobacco: Never  Vaping Use   Vaping Use: Never used  Substance Use Topics   Alcohol use: Yes    Comment: rarely   Drug use: No    Types: Marijuana    Comment: quit 2 months ago 04/23/20     Allergies   Patient has no known allergies.   Review of Systems Review of Systems   Physical Exam Triage Vital Signs ED Triage Vitals  Enc Vitals Group     BP 07/16/22 1129 138/60     Pulse Rate 07/16/22 1129 65     Resp 07/16/22 1129 12     Temp 07/16/22 1129 98.8 F (37.1 C)     Temp Source 07/16/22 1129 Oral     SpO2 07/16/22 1129 99 %     Weight --      Height --      Head Circumference --      Peak Flow --      Pain Score 07/16/22 1134 10     Pain Loc --      Pain Edu? --      Excl. in GC? --    No data found.  Updated  Vital Signs BP 138/60 (BP Location: Left Arm)   Pulse 65   Temp 98.8 F (37.1 C) (Oral)   Resp 12   SpO2 99%   Visual Acuity Right Eye Distance:   Left Eye Distance:   Bilateral Distance:    Right Eye Near:   Left Eye Near:    Bilateral Near:     Physical Exam Constitutional:      General: He is not in acute distress. HENT:     Head: Normocephalic and atraumatic.     Nose: Nose normal.     Mouth/Throat:     Mouth: Mucous membranes are moist.  Cardiovascular:     Rate and Rhythm: Normal rate.  Pulmonary:     Effort: Pulmonary effort is normal.  Musculoskeletal:     Right hand: Swelling and tenderness present. Decreased strength of finger abduction. Normal capillary refill. Normal pulse.     Left hand: Normal.     Cervical back: Normal range of motion.  Skin:    General: Skin is warm.     Capillary Refill: Capillary refill takes 2 to 3 seconds.  Neurological:     General: No focal deficit present.     Mental Status: He is alert and oriented to person,  place, and time.  Psychiatric:        Behavior: Behavior normal.      UC Treatments / Results  Labs (all labs ordered are listed, but only abnormal results are displayed) Labs Reviewed - No data to display  EKG   Radiology DG Finger Middle Right  Result Date: 07/16/2022 CLINICAL DATA:  Right middle finger pain after feeling and hearing a pop last evening. Swelling with decreased range of motion. EXAM: RIGHT MIDDLE FINGER 2+V COMPARISON:  Limited correlation made with wrist radiographs 04/08/2022. FINDINGS: The mineralization and alignment are normal. There is no evidence of acute fracture or dislocation. The joint spaces appear preserved. No foreign body or focal soft tissue swelling identified. IMPRESSION: Unremarkable radiographs of the right middle finger. Electronically Signed   By: Richardean Sale M.D.   On: 07/16/2022 12:26    Procedures Procedures (including critical care time)  Medications Ordered in UC Medications - No data to display  Initial Impression / Assessment and Plan / UC Course  I have reviewed the triage vital signs and the nursing notes.  Pertinent labs & imaging results that were available during my care of the patient were reviewed by me and considered in my medical decision making (see chart for details).    Finger swelling with pain Final Clinical Impressions(s) / UC Diagnoses   Final diagnoses:  Pain in finger of right hand     Discharge Instructions      Your right hand xray is negative Will prescribe steroid which will help with the pain and inflammation of the area. Medrol Dosepak You can use ice and help the area elevated.  You can use Tylenol for breakthrough pain.  Follow up here or with PCP if symptoms persist or get worse.      ED Prescriptions   None    PDMP not reviewed this encounter.   Dionisio David Benson, [place] 07/16/22 1304

## 2022-07-16 NOTE — ED Triage Notes (Signed)
Pt is here for pain on the right hand causing some swelling .

## 2022-07-16 NOTE — Discharge Instructions (Addendum)
Your right hand xray is negative Will prescribe steroid which will help with the pain and inflammation of the area. Medrol Dosepak You can use ice and help the area elevated.  You can use Tylenol for breakthrough pain.  Follow up here or with PCP if symptoms persist or get worse.

## 2022-12-30 DIAGNOSIS — R079 Chest pain, unspecified: Secondary | ICD-10-CM

## 2023-02-01 ENCOUNTER — Inpatient Hospital Stay (HOSPITAL_COMMUNITY)
Admission: EM | Admit: 2023-02-01 | Discharge: 2023-02-03 | DRG: 312 | Disposition: A | Discharge status: Home / Self Care | Payer: Self-pay | Attending: Family Medicine | Admitting: Family Medicine

## 2023-02-01 DIAGNOSIS — I10 Essential (primary) hypertension: Secondary | ICD-10-CM | POA: Diagnosis present

## 2023-02-01 DIAGNOSIS — R0602 Shortness of breath: Secondary | ICD-10-CM | POA: Diagnosis present

## 2023-02-01 DIAGNOSIS — R55 Syncope and collapse: Principal | ICD-10-CM

## 2023-02-01 DIAGNOSIS — Z79899 Other long term (current) drug therapy: Secondary | ICD-10-CM

## 2023-02-01 DIAGNOSIS — R410 Disorientation, unspecified: Secondary | ICD-10-CM | POA: Diagnosis present

## 2023-02-01 DIAGNOSIS — R001 Bradycardia, unspecified: Secondary | ICD-10-CM

## 2023-02-01 DIAGNOSIS — R32 Unspecified urinary incontinence: Secondary | ICD-10-CM | POA: Diagnosis present

## 2023-02-01 DIAGNOSIS — F419 Anxiety disorder, unspecified: Secondary | ICD-10-CM | POA: Diagnosis present

## 2023-02-01 DIAGNOSIS — F17210 Nicotine dependence, cigarettes, uncomplicated: Secondary | ICD-10-CM | POA: Diagnosis present

## 2023-02-01 LAB — CBG MONITORING, ED: Glucose-Capillary: 84 mg/dL (ref 70–99)

## 2023-02-01 NOTE — ED Triage Notes (Signed)
Pt to ED via EMS from home. Pt states he has not been feeling well since today. Pt states he went to take his BP and woke up on the floor. Pt's HR was in the 20s with a BP of 90/50 upon EMS arrival. Pt's HR came up to 70s with EMS en route, then had 2 more syncopal episode shortly after with an HR in the 20s with EMS. EMS gave  of atropine en route. Pt's HR 90s en route post atropine. Pt denies pain. Pt c/o intermittent SOB.   EMS Vitals: 90 HR 100/60 16 RR 20 RAC

## 2023-02-02 ENCOUNTER — Inpatient Hospital Stay (HOSPITAL_COMMUNITY): Payer: PRIVATE HEALTH INSURANCE

## 2023-02-02 ENCOUNTER — Emergency Department (HOSPITAL_COMMUNITY): Payer: PRIVATE HEALTH INSURANCE

## 2023-02-02 ENCOUNTER — Other Ambulatory Visit: Payer: Self-pay

## 2023-02-02 ENCOUNTER — Encounter (HOSPITAL_COMMUNITY): Payer: Self-pay | Admitting: Emergency Medicine

## 2023-02-02 DIAGNOSIS — R55 Syncope and collapse: Secondary | ICD-10-CM | POA: Diagnosis not present

## 2023-02-02 DIAGNOSIS — I10 Essential (primary) hypertension: Secondary | ICD-10-CM

## 2023-02-02 DIAGNOSIS — R001 Bradycardia, unspecified: Secondary | ICD-10-CM

## 2023-02-02 LAB — URINALYSIS, ROUTINE W REFLEX MICROSCOPIC
Bilirubin Urine: NEGATIVE
Glucose, UA: NEGATIVE mg/dL
Hgb urine dipstick: NEGATIVE
Ketones, ur: NEGATIVE mg/dL
Leukocytes,Ua: NEGATIVE
Nitrite: NEGATIVE
Protein, ur: NEGATIVE mg/dL
Specific Gravity, Urine: 1.011 (ref 1.005–1.030)
pH: 5 (ref 5.0–8.0)

## 2023-02-02 LAB — TSH: TSH: 1.827 u[IU]/mL (ref 0.350–4.500)

## 2023-02-02 LAB — CBC WITH DIFFERENTIAL/PLATELET
Abs Immature Granulocytes: 0.06 10*3/uL (ref 0.00–0.07)
Basophils Absolute: 0.1 10*3/uL (ref 0.0–0.1)
Basophils Relative: 1 %
Eosinophils Absolute: 0.1 10*3/uL (ref 0.0–0.5)
Eosinophils Relative: 1 %
HCT: 45 % (ref 39.0–52.0)
Hemoglobin: 15.2 g/dL (ref 13.0–17.0)
Immature Granulocytes: 0 %
Lymphocytes Relative: 13 %
Lymphs Abs: 1.8 10*3/uL (ref 0.7–4.0)
MCH: 32.1 pg (ref 26.0–34.0)
MCHC: 33.8 g/dL (ref 30.0–36.0)
MCV: 95.1 fL (ref 80.0–100.0)
Monocytes Absolute: 0.6 10*3/uL (ref 0.1–1.0)
Monocytes Relative: 5 %
Neutro Abs: 10.8 10*3/uL — ABNORMAL HIGH (ref 1.7–7.7)
Neutrophils Relative %: 80 %
Platelets: 227 10*3/uL (ref 150–400)
RBC: 4.73 MIL/uL (ref 4.22–5.81)
RDW: 12 % (ref 11.5–15.5)
WBC: 13.4 10*3/uL — ABNORMAL HIGH (ref 4.0–10.5)
nRBC: 0 % (ref 0.0–0.2)

## 2023-02-02 LAB — ECHOCARDIOGRAM COMPLETE
AR max vel: 2.39 cm2
AV Peak grad: 12.1 mmHg
Ao pk vel: 1.74 m/s
Area-P 1/2: 4.17 cm2
Height: 70 in
S' Lateral: 3.1 cm
Weight: 3068.8 oz

## 2023-02-02 LAB — BASIC METABOLIC PANEL
Anion gap: 10 (ref 5–15)
BUN: 14 mg/dL (ref 6–20)
CO2: 22 mmol/L (ref 22–32)
Calcium: 8.2 mg/dL — ABNORMAL LOW (ref 8.9–10.3)
Chloride: 102 mmol/L (ref 98–111)
Creatinine, Ser: 1.15 mg/dL (ref 0.61–1.24)
GFR, Estimated: >60 mL/min (ref >60)
Glucose, Bld: 115 mg/dL — ABNORMAL HIGH (ref 70–99)
Potassium: 4.3 mmol/L (ref 3.5–5.1)
Sodium: 134 mmol/L — ABNORMAL LOW (ref 135–145)

## 2023-02-02 LAB — COMPREHENSIVE METABOLIC PANEL
ALT: 22 U/L (ref 0–44)
AST: 24 U/L (ref 15–41)
Albumin: 3.6 g/dL (ref 3.5–5.0)
Alkaline Phosphatase: 30 U/L — ABNORMAL LOW (ref 38–126)
Anion gap: 12 (ref 5–15)
BUN: 16 mg/dL (ref 6–20)
CO2: 21 mmol/L — ABNORMAL LOW (ref 22–32)
Calcium: 8.6 mg/dL — ABNORMAL LOW (ref 8.9–10.3)
Chloride: 102 mmol/L (ref 98–111)
Creatinine, Ser: 1.31 mg/dL — ABNORMAL HIGH (ref 0.61–1.24)
GFR, Estimated: >60 mL/min (ref >60)
Glucose, Bld: 124 mg/dL — ABNORMAL HIGH (ref 70–99)
Potassium: 3.7 mmol/L (ref 3.5–5.1)
Sodium: 135 mmol/L (ref 135–145)
Total Bilirubin: 0.5 mg/dL (ref 0.3–1.2)
Total Protein: 6.1 g/dL — ABNORMAL LOW (ref 6.5–8.1)

## 2023-02-02 LAB — HIV ANTIBODY (ROUTINE TESTING W REFLEX): HIV Screen 4th Generation wRfx: NONREACTIVE

## 2023-02-02 LAB — CBC
HCT: 39.5 % (ref 39.0–52.0)
Hemoglobin: 14 g/dL (ref 13.0–17.0)
MCH: 32.8 pg (ref 26.0–34.0)
MCHC: 35.4 g/dL (ref 30.0–36.0)
MCV: 92.5 fL (ref 80.0–100.0)
Platelets: 193 10*3/uL (ref 150–400)
RBC: 4.27 MIL/uL (ref 4.22–5.81)
RDW: 12.4 % (ref 11.5–15.5)
WBC: 11.1 10*3/uL — ABNORMAL HIGH (ref 4.0–10.5)
nRBC: 0 % (ref 0.0–0.2)

## 2023-02-02 LAB — TROPONIN I (HIGH SENSITIVITY)
Troponin I (High Sensitivity): 7 ng/L (ref <18)
Troponin I (High Sensitivity): 7 ng/L (ref <18)

## 2023-02-02 LAB — CORTISOL: Cortisol, Plasma: 4.7 ug/dL

## 2023-02-02 MED ORDER — ONDANSETRON HCL 4 MG/2ML IJ SOLN: 4.0000 mg | Freq: Four times a day (QID) | INTRAMUSCULAR | Status: DC | PRN

## 2023-02-02 MED ORDER — ACETAMINOPHEN 650 MG RE SUPP: 650.0000 mg | Freq: Four times a day (QID) | RECTAL | Status: DC | PRN

## 2023-02-02 MED ORDER — SODIUM CHLORIDE 0.9 % IV SOLN: INTRAVENOUS | Status: DC

## 2023-02-02 MED ORDER — GADOBUTROL 1 MMOL/ML IV SOLN
8.5000 mL | Freq: Once | INTRAVENOUS | Status: AC | PRN
  Administered 2023-02-02: 8.5 mL via INTRAVENOUS

## 2023-02-02 MED ORDER — SODIUM CHLORIDE 0.9 % IV BOLUS
500.0000 mL | Freq: Once | INTRAVENOUS | Status: AC
  Administered 2023-02-02: 500 mL via INTRAVENOUS

## 2023-02-02 MED ORDER — ACETAMINOPHEN 325 MG PO TABS: 650.0000 mg | ORAL_TABLET | Freq: Four times a day (QID) | ORAL | Status: DC | PRN

## 2023-02-02 MED ORDER — ONDANSETRON HCL 4 MG PO TABS: 4.0000 mg | ORAL_TABLET | Freq: Four times a day (QID) | ORAL | Status: DC | PRN

## 2023-02-02 MED ORDER — SODIUM CHLORIDE 0.9% FLUSH
3.0000 mL | Freq: Two times a day (BID) | INTRAVENOUS | Status: DC
  Administered 2023-02-03: 3 mL via INTRAVENOUS

## 2023-02-02 MED ORDER — ENOXAPARIN SODIUM 40 MG/0.4ML IJ SOSY
40.0000 mg | PREFILLED_SYRINGE | INTRAMUSCULAR | Status: DC
  Administered 2023-02-02 – 2023-02-03 (×2): 40 mg via SUBCUTANEOUS
  Filled 2023-02-02 (×2): qty 0.4

## 2023-02-02 NOTE — ED Notes (Signed)
Meal provided 

## 2023-02-02 NOTE — H&P (Addendum)
History and Physical    Patient: Aaron [name]:[id number] DOB: 02/26/1966 DOA: 02/01/2023 DOS: the patient was seen and examined on 02/02/2023 PCP: Pcp, No  Patient coming from: Home-family  Chief Complaint:  Chief Complaint  Patient presents with   Loss of Consciousness   Bradycardia   HPI: Aaron [name] is a 57 y.o. male with medical history significant of anxiety disorder, hypertension and right rotator cuff injury in 2014.  Patient was brought to the ER via EMS after syncopal episode at home.  Family has a blood pressure cuff available due to his history of uncontrolled hypertension in the past.  After the syncopal event the family states they took his blood pressure as blood pressure was 90/50 and heart rate in the 20s.  Unsure of accuracy of these measurements.  Apparently he was bradycardic when evaluated by EMS personnel in the field.  It is reported that his heart rate came up into the 70s en route to the hospital but unfortunately had 2 more syncopal events with EMS on the way to the hospital and his heart rate was in the 20s and this was documented by EMS.  He was given a milligram of atropine with subsequent improvement in heart rate to the 90s.  He has been having intermittent shortness of breath.  According to the patient he has given some plasma recently but this is not new for him.  He was recently started on Lotensin for his hypertension and last month while hospitalized in [place] he had a stress test that was reported as unremarkable.  Upon arrival to the ED he was stable from a vital sign standpoint.  Orthostatic vital signs were unremarkable and bradycardia no hypotension was able to be reproduced.  Troponin with flat trend at 7.  EKG reveals sinus rhythm with normal QTc.  Initial creatinine was 1.31 and after fluids was down to 1.15.  Initial white count was 13,400 with a left shift and has improved to 11.1.  CT of the head without contrast revealed no acute intracranial  process and chest x-ray showed no active disease.  Patient will be admitted by the hospitalist team for unexplained syncope with bradycardia.  Review of Systems: In addition to the above family noted that patient was very pale and diaphoretic and was incontinent of urine at home.  He woke up immediately and had no postictal signs.  Was not having any overt symptoms consistent with seizure activity.  No vomiting prior to, during or after these events.  Past Medical History:  Diagnosis Date   Anxiety    Hypertension    Rotator cuff injury 02/18/2013   Right side, untreated   Past Surgical History:  Procedure Laterality Date   HERNIA REPAIR     MULTIPLE TOOTH EXTRACTIONS     Social History:  reports that he has been smoking cigarettes. He has a 22.00 pack-year smoking history. He has never used smokeless tobacco. He reports current alcohol use. He reports that he does not use drugs.  No Known Allergies  History reviewed. No pertinent family history.  Prior to Admission medications   Medication Sig Start Date End Date Taking? Authorizing Provider  acetaminophen (TYLENOL) 500 MG tablet Take 500 mg by mouth every 6 (six) hours as needed for mild pain or moderate pain.    [provider]  HYDROcodone-acetaminophen (NORCO/VICODIN) 5-325 MG tablet Take 1 tablet by mouth every 6 (six) hours as needed. 11/23/21   Bethann Berkshire, MD  Phenyleph-CPM-DM-APAP Clarene Reamer PLUS  COLD & COUGH) 02-09-09-325 MG CAPS Take 2 capsules by mouth daily as needed (cold/cough symptoms).    [provider]  Phenyleph-Doxyl-DM-Aspirin (ALKA-SELTZER PLUS NIGHT COLD PO) Take 2 capsules by mouth at bedtime as needed (for cold/cough symptoms).    [provider]    Physical Exam: Vitals:   02/02/23 0507 02/02/23 0600 02/02/23 0645 02/02/23 0715  BP:  (!) 108/53 (!) 110/58 (!) 98/52  Pulse:  64 62 65  Resp:  Temp: 98 F (36.7 C)   98.2 F (36.8 C)  TempSrc: Oral   Oral  SpO2:   98% 97% 100%  Weight:    87 kg  Height:     (1.778 m)   Constitutional: NAD, calm, comfortable Respiratory: clear to auscultation bilaterally, no wheezing, no crackles. Normal respiratory effort. No accessory muscle use.  Cardiovascular: Regular rate occasionally bradycardic as low as 53 bpm, no murmurs / rubs / gallops. No extremity edema. 2+ pedal pulses. No carotid bruits.  Abdomen: no tenderness, no masses palpated. No hepatosplenomegaly. Bowel sounds positive.  Musculoskeletal: no clubbing / cyanosis. No joint deformity upper and lower extremities. Good ROM, no contractures. Normal muscle tone.  Skin: no rashes, lesions, ulcers. No induration Neurologic: CN 2-12 grossly intact. Sensation intact,  Strength 5/5 x all 4 extremities.  Psychiatric: Normal judgment and insight. Alert and oriented x 3. Normal mood.   Data Reviewed:  As per HPI  Assessment and Plan: Syncope with loss of consciousness Prior to yesterday's events patient has never had syncope before.  He had 3 episodes yesterday with heart rate reported in the 20s Symptoms not reproducible with orthostatic vital sign check Pulse on telemetry monitoring has been in the 50s and has maintained sinus rhythm without any episode of heart block Continue IV fluids at 75 cc/h Obtain echocardiogram Unclear if bradycardia primary precipitating event or vagal response-as a precaution will consult cardiology Check TSH and cortisol  Reported significant bradycardia As per above Continue telemetry monitoring-he did respond to atropine in the field If no reproducible events while here may need continuous cardiac monitoring in the outpatient setting Patient not on offending medications such as beta or alpha blockers prior to admission and does not take any psychotropic medications  Hypertension BP somewhat low currently therefore we will hold preadmission Lotensin  Tobacco and alcohol use Patient admits to 1 pack of cigarettes  per day and rare alcohol use typically separated by 10 to 14 days at a time    Advance Care Planning:   Code Status: Full Code   VTE prophylaxis: Lovenox  Consults: Cardiology  Family Communication: Family at bedside  Severity of Illness: The appropriate patient status for this patient is OBSERVATION. Observation status is judged to be reasonable and necessary in order to provide the required intensity of service to ensure the patient's safety. The patient's presenting symptoms, physical exam findings, and initial radiographic and laboratory data in the context of their medical condition is felt to place them at decreased risk for further clinical deterioration. Furthermore, it is anticipated that the patient will be medically stable for discharge from the hospital within 2 midnights of admission.   Author: Junious Silk, NP 02/02/2023 8:09 AM  For on call review [email or web address].

## 2023-02-02 NOTE — ED Notes (Signed)
Pt is resting in the bed with eyes closed. Pt is attached to monitor/vitals. No distress noted. Side rails x 2 up, call light within reach. Family at the bedside.

## 2023-02-02 NOTE — ED Notes (Signed)
Pt is awake, a&ox4, pale,warm and dry to touch. Pt denies any cp/shob/dizziness. Pt unattached from monitor/vitals so that he could use the restroom. Pt denies feeling dizzy while up.

## 2023-02-02 NOTE — ED Notes (Signed)
Report given Alex RN.

## 2023-02-02 NOTE — Consult Note (Addendum)
Cardiology Consultation   Patient ID: Aaron [name] MRN: [id number]; DOB: [birth year]  Admit date: 02/01/2023 Date of Consult: 02/02/2023  PCP:  Oneita Hurt No   [facility] Providers Cardiologist:  None   {   Patient Profile:   Aaron [name] is a 57 y.o. male with a hx of anxiety disorder, hypertension, tobacco use, who is being seen 02/02/2023 for the evaluation of syncope at the request of Dr. Rennis Harding.  History of Present Illness:   Aaron [name] has no prior cardiac history.  Recently patient was seen in [place] last month for uncontrolled hypertension and was prescribed benazepril.  Patient also reports having a CT, echo, and stress testing done with unremarkable findings.  At home patient states that he was watching sitting on the couch and watching television. He went to stand up and started to feel "woozy"/disoriented. The next thing he remembers is being woken up to his daughter who had heard him fall and rushed to his side.  Patient states that he had an episode of incontinence while unconscious and felt nauseous and confused after waking up for the next 5 minutes.  He states that he did not recognize his daughters and was very confused, but then symptoms resolved shortly after.  EMS was called and then evaluated him at the bedside.  While inflating the BP cuff patient had another syncopal episode, with same prodrome, but no postictal phase this time; and then once again had another syncopal episode in the ambulance.  He denies any muscle weakness after these events.  EMS noted patients HR to have dropped to the low 20s during this episode.  He was given atropine and heart rate increased to 70s.  He also reports another syncopal episode 3 months ago that he attributed to elevated BP in the 180s.    Patient denies any symptoms at this time. Denies any chest pain, palpitations, shortness of breath, lightheadedness.  Has no prior family history or personal history of cardiac  disease/hypertrophic cardiomyopathy, neurological disorders, seizures.  He does not use illicit drugs. Smokes CBD occasionally.  ED workup: Overall labs and imaging have been unremarkable.  EKG showing normal sinus rhythm with a heart rate of 80, no signs of a heart block.  Troponins negative at 7.  Orthostatic vital signs unremarkable.  Negative CT head.  Negative chest x-ray.  Glucose 115, potassium 4.2, normal TSH. Of note: Patient's blood pressure generally uncontrolled and sits in the 180s systolic and rarely below 140.  However during this admission he has been hypotensive and with low heart rate.  Past Medical History:  Diagnosis Date   Anxiety    Hypertension    Rotator cuff injury 02/18/2013   Right side, untreated    Past Surgical History:  Procedure Laterality Date   HERNIA REPAIR     MULTIPLE TOOTH EXTRACTIONS      Inpatient Medications: Scheduled Meds:  enoxaparin (LOVENOX) injection  40 mg Subcutaneous Q24H   sodium chloride flush  3 mL Intravenous Q12H   Continuous Infusions:  sodium chloride 75 mL/hr at 02/02/23 0906   PRN Meds: acetaminophen **OR** acetaminophen, ondansetron **OR** ondansetron (ZOFRAN) IV  Allergies:   No Known Allergies  Social History:   Social History   Socioeconomic History   Marital status: Widowed    Spouse name: Not on file   Number of children: Not on file   Years of education: Not on file   Highest education level: Not on file  Occupational History  Not on file  Tobacco Use   Smoking status: Every Day    Packs/day: 1.00    Years: 22.00    Additional pack years: 0.00    Total pack years: 22.00    Types: Cigarettes   Smokeless tobacco: Never  Vaping Use   Vaping Use: Never used  Substance and Sexual Activity   Alcohol use: Yes    Comment: rarely   Drug use: No    Types: Marijuana    Comment: quit 2 months ago 04/23/20   Sexual activity: Yes    Birth control/protection: None  Other Topics Concern   Not on file   Social History Narrative   Not on file   Social Determinants of Health   Financial Resource Strain: Not on file  Food Insecurity: Not on file  Transportation Needs: Not on file  Physical Activity: Not on file  Stress: Not on file  Social Connections: Not on file  Intimate Partner Violence: Not on file    Family History:   History reviewed. No pertinent family history.   ROS:  Please see the history of present illness.   All other ROS reviewed and negative.     Physical Exam/Data:   Vitals:   02/02/23 1045 02/02/23 1100 02/02/23 1115 02/02/23 1221  BP: 117/67 (!) 98/51 106/72   Pulse: (!) 57 63 61   Resp: 15 16 16    Temp:    98.1 F (36.7 C)  TempSrc:    Oral  SpO2: 97% 98% 98%   Weight:      Height:       No intake or output data in the 24 hours ending 02/02/23 1245    02/02/2023    7:15 AM 04/23/2020    3:57 PM 08/11/2016    4:57 PM  Last 3 Weights  Weight (lbs) 191 lb 12.8 oz 190 lb 180 lb  Weight (kg) 87 kg 86.183 kg 81.647 kg     Body mass index is 27.52 kg/m.  General:  Well nourished, well developed, in no acute distress HEENT: normal Neck: no JVD Vascular: No carotid bruits; Distal pulses 2+ bilaterally Cardiac:  normal S1, S2; RRR; no murmur  Lungs:  clear to auscultation bilaterally, no wheezing, rhonchi or rales  Abd: soft, nontender, no hepatomegaly  Ext: no edema Musculoskeletal:  No deformities, BUE and BLE strength normal and equal Skin: warm and dry  Neuro:  CNs 2-12 intact, no focal abnormalities noted Psych:  Normal affect   EKG:  The EKG was personally reviewed and demonstrates:  EKG showed normal sinus rhythm with a heart rate of 80.  Rare PACs.  Telemetry:  Telemetry was personally reviewed and demonstrates: Normal sinus rhythm with a heart rate between 50 and 70.  Relevant CV Studies:   Laboratory Data:  High Sensitivity Troponin:   Recent Labs  Lab 02/01/23 2355 02/02/23 0255  TROPONINIHS 7 7     Chemistry Recent Labs   Lab 02/01/23 2355 02/02/23 0728  NA 135 134*  K 3.7 4.3  CL 102 102  CO2 21* 22  GLUCOSE 124* 115*  BUN 16 14  CREATININE 1.31* 1.15  CALCIUM 8.6* 8.2*  GFRNONAA >60 >60  ANIONGAP 12 10    Recent Labs  Lab 02/01/23 2355  PROT 6.1*  ALBUMIN 3.6  AST 24  ALT 22  ALKPHOS 30*  BILITOT 0.5   Lipids No results for input(s): "CHOL", "TRIG", "HDL", "LABVLDL", "LDLCALC", "CHOLHDL" in the last 168 hours.  Hematology Recent Labs  Lab 02/01/23 2355 02/02/23 0728  WBC 13.4* 11.1*  RBC 4.73 4.27  HGB 15.2 14.0  HCT 45.0 39.5  MCV 95.1 92.5  MCH 32.1 32.8  MCHC 33.8 35.4  RDW 12.0 12.4  PLT 227 193   Thyroid  Recent Labs  Lab 02/02/23 0915  TSH 1.827    BNPNo results for input(s): "BNP", "PROBNP" in the last 168 hours.  DDimer No results for input(s): "DDIMER" in the last 168 hours.   Radiology/Studies:  CT Head Wo Contrast  Result Date: 02/02/2023 CLINICAL DATA:  Syncope. EXAM: CT HEAD WITHOUT CONTRAST TECHNIQUE: Contiguous axial images were obtained from the base of the skull through the vertex without intravenous contrast. RADIATION DOSE REDUCTION: This exam was performed according to the departmental dose-optimization program which includes automated exposure control, adjustment of the mA and/or kV according to patient size and/or use of iterative reconstruction technique. COMPARISON:  06/10/2010. FINDINGS: Brain: No acute intracranial hemorrhage, midline shift or mass effect. No extra-axial fluid collection. Gray-white matter differentiation is within normal limits. No hydrocephalus. Vascular: No hyperdense vessel or unexpected calcification. Skull: Normal. Negative for fracture or focal lesion. Sinuses/Orbits: Mild mucosal thickening in the ethmoid air cells on the right. No acute abnormality. Other: None. IMPRESSION: No acute intracranial process. Electronically Signed   By: Thornell Sartorius M.D.   On: 02/02/2023 01:50   DG Chest Portable 1 View  Result Date:  02/02/2023 CLINICAL DATA:  Syncope, bradycardia EXAM: PORTABLE CHEST 1 VIEW COMPARISON:  None Available. FINDINGS: The heart size and mediastinal contours are within normal limits. Both lungs are clear. The visualized skeletal structures are unremarkable. IMPRESSION: No active disease. Electronically Signed   By: Helyn Numbers M.D.   On: 02/02/2023 01:41     Assessment and Plan:   Recurrent syncopal episodes with bradycardia Patient with new onset of  2 witnessed and 1 unwitnessed episodes of syncope precipitated by prodrome (lightheadedness) and postictal phase (incontinence and confusion).  Per EMS heart rates have dropped to the low 20s and required atropine with response back to the 70s.  Patient signs and symptoms are concerning for possible seizure-like activity.  It is also possible patient could be having vasovagal episodes with low heart rates.  Negative for orthostatic hypotension. Will continue to evaluate for any secondary cardiac causes. Echocardiogram pending Continue to monitor telemetry for any arrhythmias or episodes of bradycardia.  Heart rate has been low around 55-65 Will plan heart monitor at discharge if telemetry unrevealing while admitted Recommend neurological evaluation   Hypertension Historically patient has had uncontrolled hypertension and has recently been put on benazepril.  Currently holding due to soft blood pressures now.  Tobacco use  Currently smokes about a pack per day.    Risk Assessment/Risk Scores:      For questions or updates, please contact [facility] Please consult [email or web address] for contact info under    Signed, Abagail Kitchens, PA-C  02/02/2023 12:45 PM   Patient seen and examined.  Agree with above documentation.  Ms. Packard is a 57 year old male with history of hypertension, tobacco use were consulted by Dr. Rennis Harding for evaluation of syncope.  He reports that he was watching TV and started to feel lightheaded.  Next thing he  remembered he was being woken up by his daughter.  He had had an episode of incontinence and was confused for about 5 minutes after the incident.  EMS was called and on arrival he had another syncopal episode.  Per EMS heart rate dropped  to the 20s during episode.  He was given atropine with improvement in heart rate to 70s.  On arrival to ED, initial vital signs notable for BP 106/68, pulse 78, SpO2 100% on room air.  Labs notable for creatinine 1.3, troponin 7 > 7, WBC 13.4, hemoglobin 15.2, platelets 227.  EKG shows sinus rhythm, PAC, rate 80, no ST abnormalities, QTc 439.  On exam, patient is alert and oriented, regular rate and rhythm, no murmurs, lungs CTAB, no LE edema or JVD.  For his recurrent syncope, description suggest likely vasovagal syncope, as he describes prodromal symptoms prior to passing out.  EMS noted very low heart rate prior to syncopal episodes.  However given his incontinence and postictal confusion, seizure remains on differential.  Would recommend neurology evaluation.  Will check echocardiogram to evaluate for structural heart disease.  Will continue to monitor on telemetry, will plan monitor on discharge.  Little Ishikawa, MD

## 2023-02-02 NOTE — Consult Note (Signed)
Neurology Consultation    Reason for Consult:Syncopal episodes with urinary incontinence and confusion   CC: Syncope  HISTORY OF PRESENT ILLNESS   HPI   Aaron [name] is a 57 y.o. male with a past medical history of anxiety, hypertension, and rotator cuff injury presenting after a syncopal episode with confusion, urinary incontinence, and bradycardia. He reports he was sitting on the edge of the bed watching TV and then became light headed. He took his blood pressure and it read 210/110 but the battery was dying so he didn't think it was accurate. He stood up to get another battery and then passed out. He woke up confused about why he was on the floor and his daughters were around him. They called EMS and he had another syncopal episode while they were taking his blood pressure. He had another 1-2 syncopal episodes in the ambulance and did receive atropine for a heart rate in the 20s. He states he felt lightheaded and weak each time he passed out.  2-3 months ago he had a couple of drinks, had chest pain, and passed out but he attributed that to high blood pressure. He had an echocardiogram done of 01/11/2023 that shows an EF of 55-60%. He drinks once every three months. He smokes THC frequently and one pack of cigarettes per day.   Regarding seizure risk factors: Denies any family history of seizures or developmental delay, no personal history of seizures, normal birth and development, no history of meningitis/encephalitis, no history of significant head trauma or brain bleeds  History is obtained from: patient, chart review   ROS: A comprehensive review of systems was negative.   PAST MEDICAL HISTORY    Past Medical History:  Past Medical History:  Diagnosis Date   Anxiety    Hypertension    Rotator cuff injury 02/18/2013   Right side, untreated    No family history on file. History reviewed. No pertinent family history.  Allergies:  No Known Allergies  Social History:    reports that he has been smoking cigarettes. He has a 22.00 pack-year smoking history. He has never used smokeless tobacco. He reports current alcohol use. He reports that he does not use drugs.    Medications (Not in a hospital admission)   EXAMINATION    Current vital signs:    02/02/2023    1:30 PM 02/02/2023   11:15 AM 02/02/2023   11:00 AM  Vitals with BMI  Systolic 104 106 98  Diastolic 62 72 51  Pulse 64 61 63    Examination:  GENERAL: Awake, alert in NAD HEENT: - Normocephalic and atraumatic, dry mm, no lymphadenopathy, no Thyromegally LUNGS - Clear to auscultation bilaterally CV - S1S2 RRR, equal pulses bilaterally. ABDOMEN - Soft, nontender, nondistended with normoactive BS Ext: warm, well perfused, intact peripheral pulses, no pedal edema  NEURO:  Mental Status: AA&Ox3  Language: speech is clear.  Intact naming, repetition, fluency, and comprehension. Cranial Nerves:  II: PERRL. Visual fields full to confrontation.  III, IV, VI: EOM in tact. Eyelids elevate symmetrically. Blinks to threat.  V: facial sensation symmetric VII: no facial asymmetry   VIII: hearing intact to voice IX, X: Palate elevates symmetrically. Phonation is normal.  ZD:[id number] shrug 5/5 and symmetrical  XII: tongue is midline without fasciculations. Motor:  RUE: 5/5     LUE: 5/5 RLE: 5/5      LLE: 5/5  Tone: is normal and bulk is normal DTRs: 2+ and symmetrical throughout   Sensation-  Intact to light touch and temperature bilaterally Coordination: FTN intact bilaterally, no ataxia in BLE., no abnormal movements  Gait- Steady, mild dizziness initially when standing     LABS   I have reviewed labs in epic and the results pertinent to this consultation are:  Lab Results  Component Value Date   LDLCALC 137 (H) 02/19/2013   Lab Results  Component Value Date   ALT 22 02/01/2023   AST 24 02/01/2023   ALKPHOS 30 (L) 02/01/2023   BILITOT 0.5 02/01/2023   Lab Results  Component  Value Date   HGBA1C 5.2 02/19/2013   Lab Results  Component Value Date   WBC 11.1 (H) 02/02/2023   HGB 14.0 02/02/2023   HCT 39.5 02/02/2023   MCV 92.5 02/02/2023   PLT 193 02/02/2023   No results found for: "VITAMINB12" No results found for: "FOLATE" Lab Results  Component Value Date   NA 134 (L) 02/02/2023   K 4.3 02/02/2023   CL 102 02/02/2023   CO2 22 02/02/2023     DIAGNOSTIC IMAGING/PROCEDURES   I have reviewed the images obtained:, as below    CT-head - no acute intracranial process  MRI brain w wo- personally reviewed, agree with radiology:   No acute intracranial process. No seizure etiology is identified.   EEG - pending  ASSESSMENT/PLAN    Assessment: 57 y.o. male with a past medical history of anxiety, hypertension, and rotator cuff injury presenting after a syncopal episode with confusion, urinary incontinence, and bradycardia reported by EMS. He was recently started on benazepril but has been hypotensive in the ED. EMS reports using an amp of atropine due to bradycardia during one of his syncopal episodes. He had an episode 2-3 months ago that he attributed to hypertension. He drinks once every three months. He smokes THC frequently and one pack of cigarettes per day.   Overall, attribute confusion to persistent bradycardia and syncopal events to loss of cerebral perfusion secondary to bradycardia.  Urinary incontinence can be seen in with syncope secondary to poor cerebral perfusion.  However due to the severity of these events, reasonable to screen the patient with EEG and MRI brain.  MRI brain reassuring, EEG formal read pending but anticipate this will be negative  Impression: Syncope vs seizure  Recommendations: - Routine EEG - MRI Brain w wo contrast - Likely will need driving precautions due to high risk syncope but would not formally institute seizure precautions - Appreciate additional syncope work up per primary team and cardiology  - Neurology  will follow-up formal EEG read but otherwise will be available as needed  -- Patient seen and examined by NP/APP with MD. MD to update note as needed.   Elmer Picker, DNP, FNP-BC [facility] Pager: [phone]  Attending Neurologist's note:  I personally saw this patient, gathering history, performing a neurologic examination, reviewing relevant labs, personally reviewing relevant imaging including MRI brain, and formulated the assessment and plan, adding the note above for completeness and clarity to accurately reflect my thoughts  Brooke Dare MD-PhD [facility] [phone] Available 7 PM to 7 AM, outside of these hours please call Neurologist on call as listed on Amion.

## 2023-02-02 NOTE — Progress Notes (Signed)
This is a 57 year old male with past medical history of hypertension and anxiety.  He was brought to the ER by family as he had several episodes of syncope.  Patient had not been feeling well since yesterday.  He was found on the floor.  When EMS arrived, patient heart rate was 120s and his blood pressure was 90/50.  He had 2 syncopal episodes on the way to the hospital.  He received atropine.  EKG in ER shows normal sinus rhythm, rate of 18, QTc 439.  Troponin 7, 7.  CT head normal.  Potassium 3.7, magnesium and lactic acid ordered.  Checks x-ray, UA was normal.  Presenting vitals 106/68, HR 76, RR 19, afebrile.  Bolus 500 cc given,   Ortho vitals normal on presentation.  Admission requested  Lying: BP: 112/72 HR:68 Sitting: BP: 120/82 HR:75 Standing 0 mins: BP: 130/81 HR: 79 Standing 3 mins: BP: 117/81 HR: 83

## 2023-02-02 NOTE — Progress Notes (Signed)
Echocardiogram 2D Echocardiogram has been performed.  Aaron [name] 02/02/2023, 4:06 PM

## 2023-02-02 NOTE — Progress Notes (Signed)
EEG complete - results pending 

## 2023-02-02 NOTE — ED Notes (Signed)
ED TO INPATIENT HANDOFF REPORT  ED Nurse Name and Phone #:  Marisue Ivan [phone]  R Name/Age/Gender Holley Bouche 57 y.o. male Room/Bed: 002C/002C  Code Status   Code Status: Full Code  Home/SNF/Other Home Patient oriented to: self, place, time, and situation Is this baseline? Yes   Triage Complete: Triage complete  Chief Complaint Syncope [R55]  Triage Note Pt to ED via EMS from home. Pt states he has not been feeling well since today. Pt states he went to take his BP and woke up on the floor. Pt's HR was in the 20s with a BP of 90/50 upon EMS arrival. Pt's HR came up to 70s with EMS en route, then had 2 more syncopal episode shortly after with an HR in the 20s with EMS. EMS gave 1mg  of atropine en route. Pt's HR 90s en route post atropine. Pt denies pain. Pt c/o intermittent SOB.   EMS Vitals: 90 HR 100/60 16 RR 20 RAC    Allergies No Known Allergies  Level of Care/Admitting Diagnosis ED Disposition     ED Disposition  Admit   Condition  --   Comment  Hospital Area: [facility] [[id number]]  Level of Care: Telemetry Cardiac [103]  May admit patient to [facility] or [facility] if equivalent level of care is available:: Yes  Covid Evaluation: Asymptomatic - no recent exposure (last 10 days) testing not required  Diagnosis: Syncope [206001]  Admitting Physician: Tresa Moore [[id number]]  Attending Physician: Tresa Moore [[id number]]  Certification:: I certify this patient will need inpatient services for at least 2 midnights  Estimated Length of Stay: 2          B Medical/Surgery History Past Medical History:  Diagnosis Date   Anxiety    Hypertension    Rotator cuff injury 02/18/2013   Right side, untreated   Past Surgical History:  Procedure Laterality Date   HERNIA REPAIR     MULTIPLE TOOTH EXTRACTIONS       A IV Location/Drains/Wounds Patient Lines/Drains/Airways Status     Active Line/Drains/Airways     Name Placement date  Placement time Site Days   Peripheral IV 02/01/23 20 G Right Antecubital 02/01/23  2345  Antecubital  1            Intake/Output Last 24 hours No intake or output data in the 24 hours ending 02/02/23 1809  Labs/Imaging Results for orders placed or performed during the hospital encounter of 02/01/23 (from the past 48 hour(s))  CBG monitoring, ED     Status: None   Collection Time: 02/01/23 11:54 PM  Result Value Ref Range   Glucose-Capillary 84 70 - 99 mg/dL    Comment: Glucose reference range applies only to samples taken after fasting for at least 8 hours.  CBC with Differential     Status: Abnormal   Collection Time: 02/01/23 11:55 PM  Result Value Ref Range   WBC 13.4 (H) 4.0 - 10.5 K/uL   RBC 4.73 4.22 - 5.81 MIL/uL   Hemoglobin 15.2 13.0 - 17.0 g/dL   HCT 82.9 56.2 - 13.0 %   MCV 95.1 80.0 - 100.0 fL   MCH 32.1 26.0 - 34.0 pg   MCHC 33.8 30.0 - 36.0 g/dL   RDW 86.5 78.4 - 69.6 %   Platelets 227 150 - 400 K/uL   nRBC 0.0 0.0 - 0.2 %   Neutrophils Relative % 80 %   Neutro Abs 10.8 (H) 1.7 - 7.7  K/uL   Lymphocytes Relative 13 %   Lymphs Abs 1.8 0.7 - 4.0 K/uL   Monocytes Relative 5 %   Monocytes Absolute 0.6 0.1 - 1.0 K/uL   Eosinophils Relative 1 %   Eosinophils Absolute 0.1 0.0 - 0.5 K/uL   Basophils Relative 1 %   Basophils Absolute 0.1 0.0 - 0.1 K/uL   Immature Granulocytes 0 %   Abs Immature Granulocytes 0.06 0.00 - 0.07 K/uL    Comment: Performed at [facility], [address] [place], [place] [address]  Comprehensive metabolic panel     Status: Abnormal   Collection Time: 02/01/23 11:55 PM  Result Value Ref Range   Sodium 135 135 - 145 mmol/L   Potassium 3.7 3.5 - 5.1 mmol/L   Chloride 102 98 - 111 mmol/L   CO2 21 (L) 22 - 32 mmol/L   Glucose, Bld 124 (H) 70 - 99 mg/dL    Comment: Glucose reference range applies only to samples taken after fasting for at least 8 hours.   BUN 16 6 - 20 mg/dL   Creatinine, Ser 6.04 (H) 0.61 - 1.24 mg/dL   Calcium  8.6 (L) 8.9 - 10.3 mg/dL   Total Protein 6.1 (L) 6.5 - 8.1 g/dL   Albumin 3.6 3.5 - 5.0 g/dL   AST 24 15 - 41 U/L   ALT 22 0 - 44 U/L   Alkaline Phosphatase 30 (L) 38 - 126 U/L   Total Bilirubin 0.5 0.3 - 1.2 mg/dL   GFR, Estimated >54 >09 mL/min    Comment: (NOTE) Calculated using the CKD-EPI Creatinine Equation (2021)    Anion gap 12 5 - 15    Comment: Performed at [facility], [address], [place], [place] [address]  Troponin I (High Sensitivity)     Status: None   Collection Time: 02/01/23 11:55 PM  Result Value Ref Range   Troponin I (High Sensitivity) 7 <18 ng/L    Comment: (NOTE) Elevated high sensitivity troponin I (hsTnI) values and significant  changes across serial measurements may suggest ACS but many other  chronic and acute conditions are known to elevate hsTnI results.  Refer to the "Links" section for chest pain algorithms and additional  guidance. Performed at [facility], [address], [place], [place] [address]   Urinalysis, Routine w reflex microscopic -Urine, Clean Catch     Status: Abnormal   Collection Time: 02/02/23  1:22 AM  Result Value Ref Range   Color, Urine YELLOW YELLOW   APPearance HAZY (A) CLEAR   Specific Gravity, Urine 1.011 1.005 - 1.030   pH 5.0 5.0 - 8.0   Glucose, UA NEGATIVE NEGATIVE mg/dL   Hgb urine dipstick NEGATIVE NEGATIVE   Bilirubin Urine NEGATIVE NEGATIVE   Ketones, ur NEGATIVE NEGATIVE mg/dL   Protein, ur NEGATIVE NEGATIVE mg/dL   Nitrite NEGATIVE NEGATIVE   Leukocytes,Ua NEGATIVE NEGATIVE    Comment: Performed at [facility], [address] [place], [place] [address]  Troponin I (High Sensitivity)     Status: None   Collection Time: 02/02/23  2:55 AM  Result Value Ref Range   Troponin I (High Sensitivity) 7 <18 ng/L    Comment: (NOTE) Elevated high sensitivity troponin I (hsTnI) values and significant  changes across serial measurements may suggest ACS but many other  chronic and acute  conditions are known to elevate hsTnI results.  Refer to the "Links" section for chest pain algorithms and additional  guidance. Performed at [facility], [address], [place], [place] [address]   HIV Antibody (routine testing w rflx)     Status: None   Collection Time: 02/02/23  7:28 AM  Result Value Ref Range   HIV Screen 4th Generation wRfx Non Reactive Non Reactive    Comment: Performed at [facility], [address], [place], [place] [address]  CBC     Status: Abnormal   Collection Time: 02/02/23  7:28 AM  Result Value Ref Range   WBC 11.1 (H) 4.0 - 10.5 K/uL   RBC 4.27 4.22 - 5.81 MIL/uL   Hemoglobin 14.0 13.0 - 17.0 g/dL   HCT 09.8 11.9 - 14.7 %   MCV 92.5 80.0 - 100.0 fL   MCH 32.8 26.0 - 34.0 pg   MCHC 35.4 30.0 - 36.0 g/dL   RDW 82.9 56.2 - 13.0 %   Platelets 193 150 - 400 K/uL   nRBC 0.0 0.0 - 0.2 %    Comment: Performed at [facility], [address], [place], [place] [address]  Basic metabolic panel     Status: Abnormal   Collection Time: 02/02/23  7:28 AM  Result Value Ref Range   Sodium 134 (L) 135 - 145 mmol/L   Potassium 4.3 3.5 - 5.1 mmol/L   Chloride 102 98 - 111 mmol/L   CO2 22 22 - 32 mmol/L   Glucose, Bld 115 (H) 70 - 99 mg/dL    Comment: Glucose reference range applies only to samples taken after fasting for at least 8 hours.   BUN 14 6 - 20 mg/dL   Creatinine, Ser 4.69 0.61 - 1.24 mg/dL   Calcium 8.2 (L) 8.9 - 10.3 mg/dL   GFR, Estimated >62 >95 mL/min    Comment: (NOTE) Calculated using the CKD-EPI Creatinine Equation (2021)    Anion gap 10 5 - 15    Comment: Performed at [facility], [address], [place], [place] [address]  TSH     Status: None   Collection Time: 02/02/23  9:15 AM  Result Value Ref Range   TSH 1.827 0.350 - 4.500 uIU/mL    Comment: Performed by a 3rd Generation assay with a functional sensitivity of <=0.01 uIU/mL. Performed at [facility], [address] [place], [place] [address]    Cortisol     Status: None   Collection Time: 02/02/23  9:16 AM  Result Value Ref Range   Cortisol, Plasma 4.7 ug/dL    Comment: (NOTE) AM    6.7 - 22.6 ug/dL PM   <02.7       ug/dL Performed at [facility], [address], [place], [place] [address]    ECHOCARDIOGRAM COMPLETE  Result Date: 02/02/2023    ECHOCARDIOGRAM REPORT   Patient Name:   JAYKUB MACKINS Date of Exam: 02/02/2023 Medical Rec #:  [id number]    Height:       70.0 in Accession #:    [id number]   Weight:       191.8 lb Date of Birth:  09-11-1966    BSA:          2.051 m Patient Age:    57 years     BP:           104/62 mmHg Patient Gender: M            HR:           66  bpm. Exam Location:  Inpatient Procedure: 2D Echo, Color Doppler and Cardiac Doppler Indications:    Syncope R55  History:        Patient has no prior history of Echocardiogram examinations.  Sonographer:    Lucendia Herrlich Referring Phys: [id number] ALLISON L ELLIS IMPRESSIONS  1. Left ventricular ejection fraction, by estimation, is 60 to 65%. The left ventricle has normal function. The left ventricle has no regional wall motion abnormalities. Left ventricular diastolic parameters are consistent with Grade I diastolic dysfunction (impaired relaxation).  2. Right ventricular systolic function is normal. The right ventricular size is normal. There is normal pulmonary artery systolic pressure. The estimated right ventricular systolic pressure is 29.5 mmHg.  3. The mitral valve is normal in structure. No evidence of mitral valve regurgitation. No evidence of mitral stenosis.  4. The aortic valve is normal in structure. Aortic valve regurgitation is not visualized. No aortic stenosis is present.  5. The inferior vena cava is dilated in size with >50% respiratory variability, suggesting right atrial pressure of 8 mmHg. FINDINGS  Left Ventricle: Left ventricular ejection fraction, by estimation, is 60 to 65%. The left ventricle has normal function. The left ventricle has no  regional wall motion abnormalities. The left ventricular internal cavity size was normal in size. There is  no left ventricular hypertrophy. Left ventricular diastolic parameters are consistent with Grade I diastolic dysfunction (impaired relaxation). Normal left ventricular filling pressure. Right Ventricle: The right ventricular size is normal. No increase in right ventricular wall thickness. Right ventricular systolic function is normal. There is normal pulmonary artery systolic pressure. The tricuspid regurgitant velocity is 2.32 m/s, and  with an assumed right atrial pressure of 8 mmHg, the estimated right ventricular systolic pressure is 29.5 mmHg. Left Atrium: Left atrial size was normal in size. Right Atrium: Right atrial size was normal in size. Pericardium: There is no evidence of pericardial effusion. Mitral Valve: The mitral valve is normal in structure. No evidence of mitral valve regurgitation. No evidence of mitral valve stenosis. Tricuspid Valve: The tricuspid valve is normal in structure. Tricuspid valve regurgitation is trivial. No evidence of tricuspid stenosis. Aortic Valve: The aortic valve is normal in structure. Aortic valve regurgitation is not visualized. No aortic stenosis is present. Aortic valve peak gradient measures 12.1 mmHg. Pulmonic Valve: The pulmonic valve was normal in structure. Pulmonic valve regurgitation is not visualized. No evidence of pulmonic stenosis. Aorta: The aortic root is normal in size and structure. Venous: The inferior vena cava is dilated in size with greater than 50% respiratory variability, suggesting right atrial pressure of 8 mmHg. IAS/Shunts: No atrial level shunt detected by color flow Doppler.  LEFT VENTRICLE PLAX 2D LVIDd:         5.10 cm   Diastology LVIDs:         3.10 cm   LV e' medial:    9.79 cm/s LV PW:         0.90 cm   LV E/e' medial:  6.8 LV IVS:        0.70 cm   LV e' lateral:   11.40 cm/s LVOT diam:     1.90 cm   LV E/e' lateral: 5.8 LV SV:          79 LV SV Index:   39 LVOT Area:     2.84 cm  3D Volume EF:                          3D EF:        69 %                          LV EDV:       157 ml                          LV ESV:       49 ml                          LV SV:        108 ml RIGHT VENTRICLE             IVC RV S prime:     15.10 cm/s  IVC diam: 2.10 cm TAPSE (M-mode): 1.6 cm LEFT ATRIUM             Index        RIGHT ATRIUM           Index LA diam:        3.60 cm 1.76 cm/m   RA Area:     14.10 cm LA Vol (A2C):   24.2 ml 11.80 ml/m  RA Volume:   33.50 ml  16.34 ml/m LA Vol (A4C):   37.0 ml 18.04 ml/m LA Biplane Vol: 33.1 ml 16.14 ml/m  AORTIC VALVE AV Area (Vmax): 2.39 cm AV Vmax:        174.00 cm/s AV Peak Grad:   12.1 mmHg LVOT Vmax:      146.67 cm/s LVOT Vmean:     95.633 cm/s LVOT VTI:       0.279 m  AORTA Ao Root diam: 3.10 cm Ao Asc diam:  2.90 cm MITRAL VALVE                TRICUSPID VALVE MV Area (PHT): 4.17 cm     TR Peak grad:   21.5 mmHg MV Decel Time: 182 msec     TR Vmax:        232.00 cm/s MV E velocity: 66.50 cm/s MV A velocity: 100.00 cm/s  SHUNTS MV E/A ratio:  0.66         Systemic VTI:  0.28 m                             Systemic Diam: 1.90 cm Armanda Magic MD Electronically signed by Armanda Magic MD Signature Date/Time: 02/02/2023/4:31:08 PM    Final    CT Head Wo Contrast  Result Date: 02/02/2023 CLINICAL DATA:  Syncope. EXAM: CT HEAD WITHOUT CONTRAST TECHNIQUE: Contiguous axial images were obtained from the base of the skull through the vertex without intravenous contrast. RADIATION DOSE REDUCTION: This exam was performed according to the departmental dose-optimization program which includes automated exposure control, adjustment of the mA and/or kV according to patient size and/or use of iterative reconstruction technique. COMPARISON:  06/10/2010. FINDINGS: Brain: No acute intracranial hemorrhage, midline shift or mass effect. No extra-axial fluid collection. Gray-white matter  differentiation is within normal limits. No hydrocephalus. Vascular: No hyperdense vessel or unexpected calcification. Skull: Normal. Negative for fracture or focal lesion. Sinuses/Orbits: Mild mucosal thickening in the ethmoid air cells on the right. No acute  abnormality. Other: None. IMPRESSION: No acute intracranial process. Electronically Signed   By: Thornell Sartorius M.D.   On: 02/02/2023 01:50   DG Chest Portable 1 View  Result Date: 02/02/2023 CLINICAL DATA:  Syncope, bradycardia EXAM: PORTABLE CHEST 1 VIEW COMPARISON:  None Available. FINDINGS: The heart size and mediastinal contours are within normal limits. Both lungs are clear. The visualized skeletal structures are unremarkable. IMPRESSION: No active disease. Electronically Signed   By: Helyn Numbers M.D.   On: 02/02/2023 01:41    Pending Labs Unresulted Labs (From admission, onward)     Start     Ordered   02/09/23 0500  Creatinine, serum  (enoxaparin (LOVENOX)    CrCl >/= 30 ml/min)  Weekly,   R     Comments: while on enoxaparin therapy    02/02/23 0729   02/03/23 0500  Basic metabolic panel  Tomorrow morning,   R        02/02/23 0730            Vitals/Pain Today's Vitals   02/02/23 1221 02/02/23 1330 02/02/23 1654 02/02/23 1742  BP:  104/62  114/68  Pulse:  64  70  Resp:  16  20  Temp: 98.1 F (36.7 C)  98.2 F (36.8 C) 98.4 F (36.9 C)  TempSrc: Oral  Oral Oral  SpO2:  100%  100%  Weight:      Height:      PainSc:   0-No pain     Isolation Precautions No active isolations  Medications Medications  enoxaparin (LOVENOX) injection 40 mg (40 mg Subcutaneous Given 02/02/23 0906)  sodium chloride flush (NS) 0.9 % injection 3 mL (3 mLs Intravenous Not Given 02/02/23 0908)  acetaminophen (TYLENOL) tablet 650 mg (has no administration in time range)    Or  acetaminophen (TYLENOL) suppository 650 mg (has no administration in time range)  ondansetron (ZOFRAN) tablet 4 mg (has no administration in time range)    Or   ondansetron (ZOFRAN) injection 4 mg (has no administration in time range)  0.9 %  sodium chloride infusion (0 mLs Intravenous Paused 02/02/23 1759)  sodium chloride 0.9 % bolus 500 mL (0 mLs Intravenous Stopped 02/02/23 0211)    Mobility walks     Focused Assessments Cardiac Assessment Handoff:  Cardiac Rhythm: Normal sinus rhythm No results found for: "CKTOTAL", "CKMB", "CKMBINDEX", "TROPONINI" No results found for: "DDIMER" Does the Patient currently have chest pain? No    R Recommendations: See Admitting Provider Note  Report given to:   Additional Notes:  Pt has been cp and without dizziness for the entire shift. He is ambulatory and can take himself to the restroom.

## 2023-02-02 NOTE — ED Provider Notes (Signed)
[place] [facility] AT [facility] Provider Note   CSN: [id number] Arrival date & time: 02/01/23  2338     History  Chief Complaint  Patient presents with   Loss of Consciousness   Bradycardia    Aaron [name] is a 57 y.o. male.  The history is provided by the patient.  Loss of Consciousness Episode history:  Multiple Most recent episode:  Today Timing:  Constant Progression:  Resolved Chronicity:  New Context: not urination   Witnessed: yes   Relieved by:  Nothing Worsened by:  Nothing Ineffective treatments:  None tried Associated symptoms: no chest pain, no fever, no palpitations, no shortness of breath, no visual change, no vomiting and no weakness   Risk factors: no congenital heart disease   Patient recently started on Losartan presents for 2 syncopal events with low BP and low HR.      Past Medical History:  Diagnosis Date   Anxiety    Hypertension    Rotator cuff injury 02/18/2013   Right side, untreated     Home Medications Prior to Admission medications   Medication Sig Start Date End Date Taking? Authorizing Provider  acetaminophen (TYLENOL) 500 MG tablet Take 500 mg by mouth every 6 (six) hours as needed for mild pain or moderate pain.    [provider]  HYDROcodone-acetaminophen (NORCO/VICODIN) 5-325 MG tablet Take 1 tablet by mouth every 6 (six) hours as needed. 11/23/21   Bethann Berkshire, MD  Phenyleph-CPM-DM-APAP (ALKA-SELTZER PLUS COLD & COUGH) 02-09-09-325 MG CAPS Take 2 capsules by mouth daily as needed (cold/cough symptoms).    [provider]  Phenyleph-Doxyl-DM-Aspirin (ALKA-SELTZER PLUS NIGHT COLD PO) Take 2 capsules by mouth at bedtime as needed (for cold/cough symptoms).    [provider]      Allergies    Patient has no known allergies.    Review of Systems   Review of Systems  Constitutional:  Negative for fever.  HENT:  Negative for facial swelling.   Eyes:  Negative for redness.   Respiratory:  Negative for shortness of breath, wheezing and stridor.   Cardiovascular:  Positive for syncope. Negative for chest pain, palpitations and leg swelling.  Gastrointestinal:  Negative for vomiting.  Neurological:  Positive for syncope. Negative for weakness.  All other systems reviewed and are negative.   Physical Exam Updated Vital Signs BP 117/74   Pulse (!) 58   Temp 98.1 F (36.7 C) (Oral)   Resp 16   SpO2 100%  Physical Exam Vitals and nursing note reviewed.  Constitutional:      General: He is not in acute distress.    Appearance: Normal appearance. He is well-developed. He is not diaphoretic.  HENT:     Head: Normocephalic and atraumatic.     Nose: Nose normal.  Eyes:     Conjunctiva/sclera: Conjunctivae normal.     Pupils: Pupils are equal, round, and reactive to light.  Cardiovascular:     Rate and Rhythm: Normal rate and regular rhythm.     Pulses: Normal pulses.     Heart sounds: Normal heart sounds.  Pulmonary:     Effort: Pulmonary effort is normal.     Breath sounds: Normal breath sounds. No wheezing or rales.  Abdominal:     General: Bowel sounds are normal.     Palpations: Abdomen is soft.     Tenderness: There is no abdominal tenderness. There is no guarding or rebound.  Musculoskeletal:  General: Normal range of motion.     Cervical back: Normal range of motion and neck supple.  Skin:    General: Skin is warm and dry.     Capillary Refill: Capillary refill takes less than 2 seconds.  Neurological:     General: No focal deficit present.     Mental Status: He is alert and oriented to person, place, and time.     Deep Tendon Reflexes: Reflexes normal.  Psychiatric:        Mood and Affect: Mood normal.        Behavior: Behavior normal.     ED Results / Procedures / Treatments   Labs (all labs ordered are listed, but only abnormal results are displayed) Results for orders placed or performed during the hospital encounter of  02/01/23  CBC with Differential  Result Value Ref Range   WBC 13.4 (H) 4.0 - 10.5 K/uL   RBC 4.73 4.22 - 5.81 MIL/uL   Hemoglobin 15.2 13.0 - 17.0 g/dL   HCT 16.1 09.6 - 04.5 %   MCV 95.1 80.0 - 100.0 fL   MCH 32.1 26.0 - 34.0 pg   MCHC 33.8 30.0 - 36.0 g/dL   RDW 40.9 81.1 - 91.4 %   Platelets 227 150 - 400 K/uL   nRBC 0.0 0.0 - 0.2 %   Neutrophils Relative % 80 %   Neutro Abs 10.8 (H) 1.7 - 7.7 K/uL   Lymphocytes Relative 13 %   Lymphs Abs 1.8 0.7 - 4.0 K/uL   Monocytes Relative 5 %   Monocytes Absolute 0.6 0.1 - 1.0 K/uL   Eosinophils Relative 1 %   Eosinophils Absolute 0.1 0.0 - 0.5 K/uL   Basophils Relative 1 %   Basophils Absolute 0.1 0.0 - 0.1 K/uL   Immature Granulocytes 0 %   Abs Immature Granulocytes 0.06 0.00 - 0.07 K/uL  Comprehensive metabolic panel  Result Value Ref Range   Sodium 135 135 - 145 mmol/L   Potassium 3.7 3.5 - 5.1 mmol/L   Chloride 102 98 - 111 mmol/L   CO2 21 (L) 22 - 32 mmol/L   Glucose, Bld 124 (H) 70 - 99 mg/dL   BUN 16 6 - 20 mg/dL   Creatinine, Ser 7.82 (H) 0.61 - 1.24 mg/dL   Calcium 8.6 (L) 8.9 - 10.3 mg/dL   Total Protein 6.1 (L) 6.5 - 8.1 g/dL   Albumin 3.6 3.5 - 5.0 g/dL   AST 24 15 - 41 U/L   ALT 22 0 - 44 U/L   Alkaline Phosphatase 30 (L) 38 - 126 U/L   Total Bilirubin 0.5 0.3 - 1.2 mg/dL   GFR, Estimated >95 >62 mL/min   Anion gap 12 5 - 15  CBG monitoring, ED  Result Value Ref Range   Glucose-Capillary 84 70 - 99 mg/dL  Troponin I (High Sensitivity)  Result Value Ref Range   Troponin I (High Sensitivity) 7 <18 ng/L   CT Head Wo Contrast  Result Date: 02/02/2023 CLINICAL DATA:  Syncope. EXAM: CT HEAD WITHOUT CONTRAST TECHNIQUE: Contiguous axial images were obtained from the base of the skull through the vertex without intravenous contrast. RADIATION DOSE REDUCTION: This exam was performed according to the departmental dose-optimization program which includes automated exposure control, adjustment of the mA and/or kV  according to patient size and/or use of iterative reconstruction technique. COMPARISON:  06/10/2010. FINDINGS: Brain: No acute intracranial hemorrhage, midline shift or mass effect. No extra-axial fluid collection. Gray-white matter differentiation is within  normal limits. No hydrocephalus. Vascular: No hyperdense vessel or unexpected calcification. Skull: Normal. Negative for fracture or focal lesion. Sinuses/Orbits: Mild mucosal thickening in the ethmoid air cells on the right. No acute abnormality. Other: None. IMPRESSION: No acute intracranial process. Electronically Signed   By: Thornell Sartorius M.D.   On: 02/02/2023 01:50   DG Chest Portable 1 View  Result Date: 02/02/2023 CLINICAL DATA:  Syncope, bradycardia EXAM: PORTABLE CHEST 1 VIEW COMPARISON:  None Available. FINDINGS: The heart size and mediastinal contours are within normal limits. Both lungs are clear. The visualized skeletal structures are unremarkable. IMPRESSION: No active disease. Electronically Signed   By: Helyn Numbers M.D.   On: 02/02/2023 01:41     EKG EKG Interpretation  Date/Time:  Tuesday Lulla Lin[place] 23 2024 23:51:21 EDT Ventricular Rate:  80 PR Interval:  125 QRS Duration: 84 QT Interval:  380 QTC Calculation: 439 R Axis:   52 Text Interpretation: Sinus rhythm Atrial premature complex Probable left atrial enlargement Confirmed by Bergen Melle ([id number]) on 02/02/2023 1:39:47 AM  Radiology CT Head Wo Contrast  Result Date: 02/02/2023 CLINICAL DATA:  Syncope. EXAM: CT HEAD WITHOUT CONTRAST TECHNIQUE: Contiguous axial images were obtained from the base of the skull through the vertex without intravenous contrast. RADIATION DOSE REDUCTION: This exam was performed according to the departmental dose-optimization program which includes automated exposure control, adjustment of the mA and/or kV according to patient size and/or use of iterative reconstruction technique. COMPARISON:  06/10/2010. FINDINGS: Brain: No acute intracranial  hemorrhage, midline shift or mass effect. No extra-axial fluid collection. Gray-white matter differentiation is within normal limits. No hydrocephalus. Vascular: No hyperdense vessel or unexpected calcification. Skull: Normal. Negative for fracture or focal lesion. Sinuses/Orbits: Mild mucosal thickening in the ethmoid air cells on the right. No acute abnormality. Other: None. IMPRESSION: No acute intracranial process. Electronically Signed   By: Thornell Sartorius M.D.   On: 02/02/2023 01:50   DG Chest Portable 1 View  Result Date: 02/02/2023 CLINICAL DATA:  Syncope, bradycardia EXAM: PORTABLE CHEST 1 VIEW COMPARISON:  None Available. FINDINGS: The heart size and mediastinal contours are within normal limits. Both lungs are clear. The visualized skeletal structures are unremarkable. IMPRESSION: No active disease. Electronically Signed   By: Helyn Numbers M.D.   On: 02/02/2023 01:41    Procedures Procedures    Medications Ordered in ED Medications  sodium chloride 0.9 % bolus 500 mL (0 mLs Intravenous Stopped 02/02/23 0211)    ED Course/ Medical Decision Making/ A&P                             Medical Decision Making Patient with 2 witnessed syncopal events with hypotension and bradycardia   Amount and/or Complexity of Data Reviewed Independent Historian: EMS    Details: Ems gave atropine see above  External Data Reviewed: notes.    Details: Previous notes reviewed  Labs: ordered.    Details: All labs reviewed: white count elevated 13.4, normal hemoglobin 15.2 and normal platelet count.  Normal sodium 135, normal potassium 3.7, elevated creatinine 1.31, normal lefts normal troponin 7  Radiology: ordered and independent interpretation performed.    Details: Negative CXR negative head CT ECG/medicine tests: ordered and independent interpretation performed. Decision-making details documented in ED Course.  Risk Decision regarding hospitalization.     Final Clinical Impression(s) / ED  Diagnoses Final diagnoses:  Syncope and collapse  Bradycardia   The patient appears reasonably stabilized for admission considering  the current resources, flow, and capabilities available in the ED at this time, and I doubt any other [facility] requiring further screening and/or treatment in the ED prior to admission.  Rx / DC Orders ED Discharge Orders     None         Sederick Jacobsen, MD 02/02/23 4540

## 2023-02-02 NOTE — ED Notes (Signed)
Attempted to collect urine sample from pt x2. Pt states he is unable to urinate at this time but will try again.

## 2023-02-03 ENCOUNTER — Other Ambulatory Visit: Payer: Self-pay | Admitting: Physician Assistant

## 2023-02-03 ENCOUNTER — Ambulatory Visit (HOSPITAL_COMMUNITY)
Admit: 2023-02-03 | Discharge: 2023-02-03 | Disposition: A | Discharge status: Home / Self Care | Payer: PRIVATE HEALTH INSURANCE | Attending: Cardiology | Admitting: Cardiology

## 2023-02-03 DIAGNOSIS — R55 Syncope and collapse: Secondary | ICD-10-CM

## 2023-02-03 DIAGNOSIS — R569 Unspecified convulsions: Secondary | ICD-10-CM

## 2023-02-03 DIAGNOSIS — R001 Bradycardia, unspecified: Secondary | ICD-10-CM | POA: Diagnosis not present

## 2023-02-03 LAB — BASIC METABOLIC PANEL
Anion gap: 5 (ref 5–15)
BUN: 15 mg/dL (ref 6–20)
CO2: 28 mmol/L (ref 22–32)
Calcium: 8.6 mg/dL — ABNORMAL LOW (ref 8.9–10.3)
Chloride: 107 mmol/L (ref 98–111)
Creatinine, Ser: 1.15 mg/dL (ref 0.61–1.24)
GFR, Estimated: >60 mL/min (ref >60)
Glucose, Bld: 96 mg/dL (ref 70–99)
Potassium: 4 mmol/L (ref 3.5–5.1)
Sodium: 140 mmol/L (ref 135–145)

## 2023-02-03 NOTE — Discharge Summary (Addendum)
Physician Discharge Summary  Aaron [name]:[id number] DOB: 08-17-1966 DOA: 02/01/2023  PCP: Oneita Hurt, No  Admit date: 02/01/2023  Discharge date: 02/03/2023  Admitted From: Home.  Disposition:  Home  Recommendations for Outpatient Follow-up:  Follow up with PCP in 1-2 weeks Please obtain BMP/CBC in one week Syncope workup has been unremarkable.  Likely vasovagal. MRI, EEG unremarkable.  Echocardiogram within normal limit. Resume Losartan if BP improves. Avoid driving for 6 months.  Home Health: None Equipment/Devices:None  Discharge Condition: Stable CODE STATUS:Full code Diet recommendation: Heart Healthy   Brief [facility] Course: This 57 y.o. male with medical history significant of anxiety disorder, hypertension and right rotator cuff injury in 2014.  Patient was brought to the ER via EMS after syncopal episode at home.  Family has a blood pressure cuff available due to his history of uncontrolled hypertension in the past.  After the syncopal event the family states they took his blood pressure as blood pressure was 90/50 and heart rate in the 20s.  Unsure of accuracy of these measurements.  Apparently he was bradycardic when evaluated by EMS personnel in the field.  It is reported that his heart rate came up into the 70s en route to the hospital but unfortunately had 2 more syncopal events with EMS on the way to the hospital and his heart rate was in the 20s and this was documented by EMS.  He was given a milligram of atropine with subsequent improvement in heart rate to the 90s.  He has been having intermittent shortness of breath.  According to the patient he has given some plasma recently but this is not new for him.  He was recently started on Lotensin for his hypertension and last month while hospitalized in [place] he had a stress test that was reported as unremarkable.   Upon arrival to the ED he was stable from a vital sign standpoint.  Orthostatic vital signs were  unremarkable and bradycardia no hypotension was able to be reproduced.  Troponin with flat trend at 7.  EKG reveals sinus rhythm with normal QTc.  Initial creatinine was 1.31 and after fluids was down to 1.15.  Initial white count was 13,400 with a left shift and has improved to 11.1.  CT of the head without contrast revealed no acute intracranial process and chest x-ray showed no active disease.  Patient was admitted by the hospitalist team for unexplained syncope with bradycardia.  Cardiology and Neurology was consulted.  Patient underwent CT head and MRI which was unremarkable.  EEG no evidence of seizures.  Cardiologist think it is most likely vasovagal syncope.  Echocardiogram unremarkable.  Patient feels better and wants to be discharged.  Cardiology recommended Zio patch monitoring at discharge and follow-up in outpatient.  Patient is being discharged home.  Discharge Diagnoses:  Principal Problem:   Syncope Active Problems:   Bradycardia   Primary hypertension    Discharge Instructions  Discharge Instructions     Ambulatory Referral for Lung Cancer Scre   Complete by: As directed    Call MD for:  difficulty breathing, headache or visual disturbances   Complete by: As directed    Call MD for:  persistant dizziness or light-headedness   Complete by: As directed    Call MD for:  persistant nausea and vomiting   Complete by: As directed    Diet - low sodium heart healthy   Complete by: As directed    Diet Carb Modified   Complete by: As directed  Discharge instructions   Complete by: As directed    Advised to follow-up with primary care physician in 1 week. Syncope workup has been unremarkable.  Likely vasovagal. MRI, EEG unremarkable.  Echocardiogram within normal limit.   Increase activity slowly   Complete by: As directed       Allergies as of 03-Mar-2023   No Known Allergies      Medication List     STOP taking these medications    HYDROcodone-acetaminophen  5-325 MG tablet Commonly known as: NORCO/VICODIN       TAKE these medications    losartan 25 MG tablet Commonly known as: COZAAR Take 25 mg by mouth daily.   Potassium 99 MG Tabs Take 99 mg by mouth daily.        Follow-up Information     [facility]. Go on 02/15/2023.   Why: @2 :15pm Contact information: [address] [place] [address] [phone]        Little Ishikawa, MD Follow up.   Specialties: Cardiology, Radiology Why: [facility] location - cardiology follow-up arranged on Tuesday Mar 08, 2023 8:40 AM (Arrive by 8:25 AM). Contact information: [address] [place] [address] [phone]                No Known Allergies  Consultations: Cardiology Neurology   Procedures/Studies: EEG adult  Result Date: 03-03-2023 Charlsie Quest, MD     03/03/2023  8:49 AM Patient Name: [name] MRN: [id number] Epilepsy Attending: Charlsie Quest Referring Physician/Provider: Elmer Picker, NP Date: 02/02/2023 Duration: 23.05 mins Patient history: 57 y.o. male with a past medical history of anxiety, hypertension, and rotator cuff injury presenting after a syncopal episode with confusion, urinary incontinence, and bradycardia reported by EMS. EEG to evaluate for seizure. Level of alertness: Awakeasleep AEDs during EEG study: None Technical aspects: This EEG study was done with scalp electrodes positioned according to the 10-20 International system of electrode placement. Electrical activity was reviewed with band pass filter of 1-70Hz , sensitivity of 7 uV/mm, display speed of 29mm/sec with a 60Hz  notched filter applied as appropriate. EEG data were recorded continuously and digitally stored.  Video monitoring was available and reviewed as appropriate. Description: The posterior dominant rhythm consists of 9-10 Hz activity of moderate voltage (25-35 uV) seen predominantly in posterior  head regions, symmetric and reactive to eye opening and eye closing. Sleep was characterized by vertex waves, sleep spindles (12 to 14 Hz), maximal frontocentral region. Hyperventilation did not show any EEG change.  Physiologic photic driving was not seen during photic stimulation.  IMPRESSION: This study is within normal limits. No seizures or epileptiform discharges were seen throughout the recording. A normal interictal EEG does not exclude the diagnosis of epilepsy. Charlsie Quest   MR BRAIN W WO CONTRAST  Result Date: 02/02/2023 CLINICAL DATA:  Seizure EXAM: MRI HEAD WITHOUT AND WITH CONTRAST TECHNIQUE: Multiplanar, multiecho pulse sequences of the brain and surrounding structures were obtained without and with intravenous contrast. CONTRAST:  8.52mL GADAVIST GADOBUTROL 1 MMOL/ML IV SOLN COMPARISON:  None Available. FINDINGS: Evaluation is potentially limited by the exclusion of the anterior inferior frontal lobes from the field of view on the axial postcontrast sequences, however these areas are covered on the coronal sequences and are felt to be adequately evaluated. Brain: No restricted diffusion to suggest acute or subacute infarct. No abnormal parenchymal or meningeal enhancement. The hippocampi are symmetric in size and signal. No  heterotopia or evidence of cortical dysgenesis. No acute hemorrhage, mass, mass effect, or midline shift. No hemosiderin deposition to suggest remote hemorrhage. No hydrocephalus or extra-axial collection. Cerebral volume is normal for age. No evidence of remote cortical or lacunar infarct. Vascular: Normal arterial flow voids. Normal arterial and venous enhancement. Skull and upper cervical spine: Normal marrow signal. Sinuses/Orbits: Mucosal thickening in the ethmoid air cells in inferior right frontal sinus. No acute finding in the orbits. Other: Trace fluid in the mastoid air cells. IMPRESSION: No acute intracranial process. No seizure etiology is identified.  Electronically Signed   By: Wiliam Ke M.D.   On: 02/02/2023 19:15   ECHOCARDIOGRAM COMPLETE  Result Date: 02/02/2023    ECHOCARDIOGRAM REPORT   Patient Name:   JAQUAVIAN FIRKUS Date of Exam: 02/02/2023 Medical Rec #:  [id number]    Height:       70.0 in Accession #:    [id number]   Weight:       191.8 lb Date of Birth:  Nov 30, 1965    BSA:          2.051 m Patient Age:    57 years     BP:           104/62 mmHg Patient Gender: M            HR:           66 bpm. Exam Location:  Inpatient Procedure: 2D Echo, Color Doppler and Cardiac Doppler Indications:    Syncope R55  History:        Patient has no prior history of Echocardiogram examinations.  Sonographer:    Lucendia Herrlich Referring Phys: [id number] ALLISON L ELLIS IMPRESSIONS  1. Left ventricular ejection fraction, by estimation, is 60 to 65%. The left ventricle has normal function. The left ventricle has no regional wall motion abnormalities. Left ventricular diastolic parameters are consistent with Grade I diastolic dysfunction (impaired relaxation).  2. Right ventricular systolic function is normal. The right ventricular size is normal. There is normal pulmonary artery systolic pressure. The estimated right ventricular systolic pressure is 29.5 mmHg.  3. The mitral valve is normal in structure. No evidence of mitral valve regurgitation. No evidence of mitral stenosis.  4. The aortic valve is normal in structure. Aortic valve regurgitation is not visualized. No aortic stenosis is present.  5. The inferior vena cava is dilated in size with >50% respiratory variability, suggesting right atrial pressure of 8 mmHg. FINDINGS  Left Ventricle: Left ventricular ejection fraction, by estimation, is 60 to 65%. The left ventricle has normal function. The left ventricle has no regional wall motion abnormalities. The left ventricular internal cavity size was normal in size. There is  no left ventricular hypertrophy. Left ventricular diastolic parameters are consistent with  Grade I diastolic dysfunction (impaired relaxation). Normal left ventricular filling pressure. Right Ventricle: The right ventricular size is normal. No increase in right ventricular wall thickness. Right ventricular systolic function is normal. There is normal pulmonary artery systolic pressure. The tricuspid regurgitant velocity is 2.32 m/s, and  with an assumed right atrial pressure of 8 mmHg, the estimated right ventricular systolic pressure is 29.5 mmHg. Left Atrium: Left atrial size was normal in size. Right Atrium: Right atrial size was normal in size. Pericardium: There is no evidence of pericardial effusion. Mitral Valve: The mitral valve is normal in structure. No evidence of mitral valve regurgitation. No evidence of mitral valve stenosis. Tricuspid Valve: The tricuspid valve is normal in structure. Tricuspid valve  regurgitation is trivial. No evidence of tricuspid stenosis. Aortic Valve: The aortic valve is normal in structure. Aortic valve regurgitation is not visualized. No aortic stenosis is present. Aortic valve peak gradient measures 12.1 mmHg. Pulmonic Valve: The pulmonic valve was normal in structure. Pulmonic valve regurgitation is not visualized. No evidence of pulmonic stenosis. Aorta: The aortic root is normal in size and structure. Venous: The inferior vena cava is dilated in size with greater than 50% respiratory variability, suggesting right atrial pressure of 8 mmHg. IAS/Shunts: No atrial level shunt detected by color flow Doppler.  LEFT VENTRICLE PLAX 2D LVIDd:         5.10 cm   Diastology LVIDs:         3.10 cm   LV e' medial:    9.79 cm/s LV PW:         0.90 cm   LV E/e' medial:  6.8 LV IVS:        0.70 cm   LV e' lateral:   11.40 cm/s LVOT diam:     1.90 cm   LV E/e' lateral: 5.8 LV SV:         79 LV SV Index:   39 LVOT Area:     2.84 cm                           3D Volume EF:                          3D EF:        69 %                          LV EDV:       157 ml                           LV ESV:       49 ml                          LV SV:        108 ml RIGHT VENTRICLE             IVC RV S prime:     15.10 cm/s  IVC diam: 2.10 cm TAPSE (M-mode): 1.6 cm LEFT ATRIUM             Index        RIGHT ATRIUM           Index LA diam:        3.60 cm 1.76 cm/m   RA Area:     14.10 cm LA Vol (A2C):   24.2 ml 11.80 ml/m  RA Volume:   33.50 ml  16.34 ml/m LA Vol (A4C):   37.0 ml 18.04 ml/m LA Biplane Vol: 33.1 ml 16.14 ml/m  AORTIC VALVE AV Area (Vmax): 2.39 cm AV Vmax:        174.00 cm/s AV Peak Grad:   12.1 mmHg LVOT Vmax:      146.67 cm/s LVOT Vmean:     95.633 cm/s LVOT VTI:       0.279 m  AORTA Ao Root diam: 3.10 cm Ao Asc diam:  2.90 cm MITRAL VALVE                TRICUSPID VALVE MV Area (  PHT): 4.17 cm     TR Peak grad:   21.5 mmHg MV Decel Time: 182 msec     TR Vmax:        232.00 cm/s MV E velocity: 66.50 cm/s MV A velocity: 100.00 cm/s  SHUNTS MV E/A ratio:  0.66         Systemic VTI:  0.28 m                             Systemic Diam: 1.90 cm Armanda Magic MD Electronically signed by Armanda Magic MD Signature Date/Time: 02/02/2023/4:31:08 PM    Final    CT Head Wo Contrast  Result Date: 02/02/2023 CLINICAL DATA:  Syncope. EXAM: CT HEAD WITHOUT CONTRAST TECHNIQUE: Contiguous axial images were obtained from the base of the skull through the vertex without intravenous contrast. RADIATION DOSE REDUCTION: This exam was performed according to the departmental dose-optimization program which includes automated exposure control, adjustment of the mA and/or kV according to patient size and/or use of iterative reconstruction technique. COMPARISON:  06/10/2010. FINDINGS: Brain: No acute intracranial hemorrhage, midline shift or mass effect. No extra-axial fluid collection. Gray-white matter differentiation is within normal limits. No hydrocephalus. Vascular: No hyperdense vessel or unexpected calcification. Skull: Normal. Negative for fracture or focal lesion. Sinuses/Orbits: Mild mucosal thickening  in the ethmoid air cells on the right. No acute abnormality. Other: None. IMPRESSION: No acute intracranial process. Electronically Signed   By: Thornell Sartorius M.D.   On: 02/02/2023 01:50   DG Chest Portable 1 View  Result Date: 02/02/2023 CLINICAL DATA:  Syncope, bradycardia EXAM: PORTABLE CHEST 1 VIEW COMPARISON:  None Available. FINDINGS: The heart size and mediastinal contours are within normal limits. Both lungs are clear. The visualized skeletal structures are unremarkable. IMPRESSION: No active disease. Electronically Signed   By: Helyn Numbers M.D.   On: 02/02/2023 01:41     Subjective: Patient was seen and examined at bedside.  Overnight events noted.   Patient report doing much better and wants to be discharged.   Discharge Exam: Vitals:   02/02/23 2355 02/03/23 0439  BP: (!) 119/52 119/68  Pulse: 67 66  Resp:  18  Temp: 98.3 F (36.8 C) 98.6 F (37 C)  SpO2: 98% 98%   Vitals:   02/02/23 1742 02/02/23 1914 02/02/23 2355 02/03/23 0439  BP: 114/68 135/67 (!) 119/52 119/68  Pulse: 70 60 67 66  Resp: Temp: 98.4 F (36.9 C) 98.9 F (37.2 C) 98.3 F (36.8 C) 98.6 F (37 C)  TempSrc: Oral Oral  Oral  SpO2: 100% 97% 98% 98%  Weight:    86.5 kg  Height:        General: Pt is alert, awake, not in acute distress Cardiovascular: RRR, S1/S2 +, no rubs, no gallops Respiratory: CTA bilaterally, no wheezing, no rhonchi Abdominal: Soft, NT, ND, bowel sounds + Extremities: no edema, no cyanosis    The results of significant diagnostics from this hospitalization (including imaging, microbiology, ancillary and laboratory) are listed below for reference.     Microbiology: No results found for this or any previous visit (from the past 240 hour(s)).   Labs: BNP (last 3 results) No results for input(s): "BNP" in the last 8760 hours. Basic Metabolic Panel: Recent Labs  Lab 02/01/23 2355 02/02/23 0728 02/03/23 0112  NA 135 134* 140  K 3.7 4.3 4.0  CL 102 102  107  CO2 21* 22 28  GLUCOSE 124* 115* 96  BUN 16 14 15   CREATININE 1.31* 1.15 1.15  CALCIUM 8.6* 8.2* 8.6*   Liver Function Tests: Recent Labs  Lab 02/01/23 2355  AST 24  ALT 22  ALKPHOS 30*  BILITOT 0.5  PROT 6.1*  ALBUMIN 3.6   No results for input(s): "LIPASE", "AMYLASE" in the last 168 hours. No results for input(s): "AMMONIA" in the last 168 hours. CBC: Recent Labs  Lab 02/01/23 2355 02/02/23 0728  WBC 13.4* 11.1*  NEUTROABS 10.8*  --   HGB 15.2 14.0  HCT 45.0 39.5  MCV 95.1 92.5  PLT 227 193   Cardiac Enzymes: No results for input(s): "CKTOTAL", "CKMB", "CKMBINDEX", "TROPONINI" in the last 168 hours. BNP: Invalid input(s): "POCBNP" CBG: Recent Labs  Lab 02/01/23 2354  GLUCAP 84   D-Dimer No results for input(s): "DDIMER" in the last 72 hours. Hgb A1c No results for input(s): "HGBA1C" in the last 72 hours. Lipid Profile No results for input(s): "CHOL", "HDL", "LDLCALC", "TRIG", "CHOLHDL", "LDLDIRECT" in the last 72 hours. Thyroid function studies Recent Labs    02/02/23 0915  TSH 1.827   Anemia work up No results for input(s): "VITAMINB12", "FOLATE", "FERRITIN", "TIBC", "IRON", "RETICCTPCT" in the last 72 hours. Urinalysis    Component Value Date/Time   COLORURINE YELLOW 02/02/2023 0122   APPEARANCEUR HAZY (A) 02/02/2023 0122   LABSPEC 1.011 02/02/2023 0122   PHURINE 5.0 02/02/2023 0122   GLUCOSEU NEGATIVE 02/02/2023 0122   HGBUR NEGATIVE 02/02/2023 0122   BILIRUBINUR NEGATIVE 02/02/2023 0122   KETONESUR NEGATIVE 02/02/2023 0122   PROTEINUR NEGATIVE 02/02/2023 0122   UROBILINOGEN 0.2 02/19/2013 1810   NITRITE NEGATIVE 02/02/2023 0122   LEUKOCYTESUR NEGATIVE 02/02/2023 0122   Sepsis Labs Recent Labs  Lab 02/01/23 2355 02/02/23 0728  WBC 13.4* 11.1*   Microbiology No results found for this or any previous visit (from the past 240 hour(s)).   Time coordinating discharge: Over 30 minutes  SIGNED:   Willeen Niece, MD  [facility] 02/03/2023, 5:17 PM Pager   If 7PM-7AM, please contact night-coverage

## 2023-02-03 NOTE — Procedures (Signed)
Patient Name: Aaron [name]  MRN: [id number]  Epilepsy Attending: Charlsie Quest  Referring Physician/Provider: Elmer Picker, NP  Date: 02/02/2023 Duration: 23.05 mins  Patient history: 57 y.o. male with a past medical history of anxiety, hypertension, and rotator cuff injury presenting after a syncopal episode with confusion, urinary incontinence, and bradycardia reported by EMS. EEG to evaluate for seizure.  Level of alertness: Awakeasleep  AEDs during EEG study: None  Technical aspects: This EEG study was done with scalp electrodes positioned according to the 10-20 International system of electrode placement. Electrical activity was reviewed with band pass filter of 1-70Hz , sensitivity of 7 uV/mm, display speed of 52mm/sec with a  notched filter applied as appropriate. EEG data were recorded continuously and digitally stored.  Video monitoring was available and reviewed as appropriate.  Description: The posterior dominant rhythm consists of 9-10 Hz activity of moderate voltage (25-35 uV) seen predominantly in posterior head regions, symmetric and reactive to eye opening and eye closing. Sleep was characterized by vertex waves, sleep spindles (12 to 14 Hz), maximal frontocentral region. Hyperventilation did not show any EEG change.  Physiologic photic driving was not seen during photic stimulation.    IMPRESSION: This study is within normal limits. No seizures or epileptiform discharges were seen throughout the recording.  A normal interictal EEG does not exclude the diagnosis of epilepsy.  Antolin Belsito Annabelle Harman

## 2023-02-03 NOTE — Discharge Instructions (Signed)
Advised to follow-up with primary care physician in 1 week. Syncope workup has been unremarkable.  Likely vasovagal. MRI, EEG unremarkable.  Echocardiogram within normal limit.

## 2023-02-03 NOTE — Progress Notes (Signed)
Spoke with echo dept to assist with placing 2 week live Zio prior to pt discharge. Spoke with pt to ensure he is aware and confirmed his primary phone number and emergency contact number (daughter Misty Stanley). Nurse aware not to DC until patient has Zio placed. F/u appt has been arranged and placed on AVS as well.

## 2023-02-03 NOTE — Progress Notes (Signed)
Opened in error

## 2023-02-03 NOTE — TOC Transition Note (Signed)
Transition of Care [facility]) - CM/SW Discharge Note   Patient Details  Name: Aaron [name] MRN: [id number] Date of Birth: Feb 26, 1966  Transition of Care [facility]) CM/SW Contact:  Leone Haven, RN Phone Number: 02/03/2023, 11:33 AM   Clinical Narrative:    Patient is for dc today, daughter will transport him home today.  Secretary will make hospital follow up apt on AVS. He is getting a zio patch thru [facility] prior to dc.          Patient Goals and CMS Choice      Discharge Placement                         Discharge Plan and Services Additional resources added to the After Visit Summary for                                       Social Determinants of Health (SDOH) Interventions SDOH Screenings   Food Insecurity: No Food Insecurity (02/02/2023)  Housing: Low Risk  (02/02/2023)  Transportation Needs: No Transportation Needs (02/02/2023)  Utilities: Not At Risk (02/02/2023)  Tobacco Use: High Risk (02/02/2023)     Readmission Risk Interventions     No data to display

## 2023-02-03 NOTE — Progress Notes (Signed)
Rounding Note    Patient Name: Aaron [name] Date of Encounter: 02/03/2023  [facility] Cardiologist: None   Subjective   Denies any chest pain, dyspnea, or lightheadedness  Inpatient Medications    Scheduled Meds:  enoxaparin (LOVENOX) injection  40 mg Subcutaneous Q24H   sodium chloride flush  3 mL Intravenous Q12H   Continuous Infusions:  sodium chloride 75 mL/hr at 02/03/23 0626   PRN Meds: acetaminophen **OR** acetaminophen, ondansetron **OR** ondansetron (ZOFRAN) IV   Vital Signs    Vitals:   02/02/23 1742 02/02/23 1914 02/02/23 2355 02/03/23 0439  BP: 114/68 135/67 (!) 119/52 119/68  Pulse: 70 60 67 66  Resp: 20 20  18   Temp: 98.4 F (36.9 C) 98.9 F (37.2 C) 98.3 F (36.8 C) 98.6 F (37 C)  TempSrc: Oral Oral  Oral  SpO2: 100% 97% 98% 98%  Weight:    86.5 kg  Height:        Intake/Output Summary (Last 24 hours) at 02/03/2023 1049 Last data filed at 02/03/2023 0300 Gross per 24 hour  Intake 1005 ml  Output --  Net 1005 ml      02/03/2023    4:39 AM 02/02/2023    7:15 AM 04/23/2020    3:57 PM  Last 3 Weights  Weight (lbs) 190 lb 11.2 oz 191 lb 12.8 oz 190 lb  Weight (kg) 86.501 kg 87 kg 86.183 kg      Telemetry    NSR - Personally Reviewed  ECG    No new EKG - Personally Reviewed  Physical Exam   GEN: No acute distress.   Neck: No JVD Cardiac: RRR, no murmurs, rubs, or gallops.  Respiratory: Clear to auscultation bilaterally. GI: Soft, nontender, non-distended  MS: No edema; No deformity. Neuro:  Nonfocal  Psych: Normal affect   Labs    High Sensitivity Troponin:   Recent Labs  Lab 02/01/23 2355 02/02/23 0255  TROPONINIHS 7 7     Chemistry Recent Labs  Lab 02/01/23 2355 02/02/23 0728 02/03/23 0112  NA 135 134* 140  K 3.7 4.3 4.0  CL 102 102 107  CO2 21* 22 28  GLUCOSE 124* 115* 96  BUN 16 14 15   CREATININE 1.31* 1.15 1.15  CALCIUM 8.6* 8.2* 8.6*  PROT 6.1*  --   --   ALBUMIN 3.6  --   --   AST 24  --    --   ALT 22  --   --   ALKPHOS 30*  --   --   BILITOT 0.5  --   --   GFRNONAA >60 >60 >60  ANIONGAP 12 10 5     Lipids No results for input(s): "CHOL", "TRIG", "HDL", "LABVLDL", "LDLCALC", "CHOLHDL" in the last 168 hours.  Hematology Recent Labs  Lab 02/01/23 2355 02/02/23 0728  WBC 13.4* 11.1*  RBC 4.73 4.27  HGB 15.2 14.0  HCT 45.0 39.5  MCV 95.1 92.5  MCH 32.1 32.8  MCHC 33.8 35.4  RDW 12.0 12.4  PLT 227 193   Thyroid  Recent Labs  Lab 02/02/23 0915  TSH 1.827    BNPNo results for input(s): "BNP", "PROBNP" in the last 168 hours.  DDimer No results for input(s): "DDIMER" in the last 168 hours.   Radiology    EEG adult  Result Date: 02/03/2023 Charlsie Quest, MD     02/03/2023  8:49 AM Patient Name: Aaron [name] MRN: [id number] Epilepsy Attending: Charlsie Quest Referring Physician/Provider: Elmer Picker,  NP Date: 02/02/2023 Duration: 23.05 mins Patient history: 57 y.o. male with a past medical history of anxiety, hypertension, and rotator cuff injury presenting after a syncopal episode with confusion, urinary incontinence, and bradycardia reported by EMS. EEG to evaluate for seizure. Level of alertness: Awakeasleep AEDs during EEG study: None Technical aspects: This EEG study was done with scalp electrodes positioned according to the 10-20 International system of electrode placement. Electrical activity was reviewed with band pass filter of 1-70Hz , sensitivity of 7 uV/mm, display speed of 50mm/sec with a  notched filter applied as appropriate. EEG data were recorded continuously and digitally stored.  Video monitoring was available and reviewed as appropriate. Description: The posterior dominant rhythm consists of 9-10 Hz activity of moderate voltage (25-35 uV) seen predominantly in posterior head regions, symmetric and reactive to eye opening and eye closing. Sleep was characterized by vertex waves, sleep spindles (12 to 14 Hz), maximal frontocentral region.  Hyperventilation did not show any EEG change.  Physiologic photic driving was not seen during photic stimulation.  IMPRESSION: This study is within normal limits. No seizures or epileptiform discharges were seen throughout the recording. A normal interictal EEG does not exclude the diagnosis of epilepsy. Charlsie Quest   MR BRAIN W WO CONTRAST  Result Date: 02/02/2023 CLINICAL DATA:  Seizure EXAM: MRI HEAD WITHOUT AND WITH CONTRAST TECHNIQUE: Multiplanar, multiecho pulse sequences of the brain and surrounding structures were obtained without and with intravenous contrast. CONTRAST:  8.59mL GADAVIST GADOBUTROL 1 MMOL/ML IV SOLN COMPARISON:  None Available. FINDINGS: Evaluation is potentially limited by the exclusion of the anterior inferior frontal lobes from the field of view on the axial postcontrast sequences, however these areas are covered on the coronal sequences and are felt to be adequately evaluated. Brain: No restricted diffusion to suggest acute or subacute infarct. No abnormal parenchymal or meningeal enhancement. The hippocampi are symmetric in size and signal. No heterotopia or evidence of cortical dysgenesis. No acute hemorrhage, mass, mass effect, or midline shift. No hemosiderin deposition to suggest remote hemorrhage. No hydrocephalus or extra-axial collection. Cerebral volume is normal for age. No evidence of remote cortical or lacunar infarct. Vascular: Normal arterial flow voids. Normal arterial and venous enhancement. Skull and upper cervical spine: Normal marrow signal. Sinuses/Orbits: Mucosal thickening in the ethmoid air cells in inferior right frontal sinus. No acute finding in the orbits. Other: Trace fluid in the mastoid air cells. IMPRESSION: No acute intracranial process. No seizure etiology is identified. Electronically Signed   By: Wiliam Ke M.D.   On: 02/02/2023 19:15   ECHOCARDIOGRAM COMPLETE  Result Date: 02/02/2023    ECHOCARDIOGRAM REPORT   Patient Name:   Aaron [name] Date of Exam: 02/02/2023 Medical Rec #:  [id number]    Height:       70.0 in Accession #:    [id number]   Weight:       191.8 lb Date of Birth:  05/10/1966    BSA:          2.051 m Patient Age:    57 years     BP:           104/62 mmHg Patient Gender: M            HR:           66 bpm. Exam Location:  Inpatient Procedure: 2D Echo, Color Doppler and Cardiac Doppler Indications:    Syncope R55  History:        Patient has no prior  history of Echocardiogram examinations.  Sonographer:    Lucendia Herrlich Referring Phys: [id number] ALLISON L ELLIS IMPRESSIONS  1. Left ventricular ejection fraction, by estimation, is 60 to 65%. The left ventricle has normal function. The left ventricle has no regional wall motion abnormalities. Left ventricular diastolic parameters are consistent with Grade I diastolic dysfunction (impaired relaxation).  2. Right ventricular systolic function is normal. The right ventricular size is normal. There is normal pulmonary artery systolic pressure. The estimated right ventricular systolic pressure is 29.5 mmHg.  3. The mitral valve is normal in structure. No evidence of mitral valve regurgitation. No evidence of mitral stenosis.  4. The aortic valve is normal in structure. Aortic valve regurgitation is not visualized. No aortic stenosis is present.  5. The inferior vena cava is dilated in size with >50% respiratory variability, suggesting right atrial pressure of 8 mmHg. FINDINGS  Left Ventricle: Left ventricular ejection fraction, by estimation, is 60 to 65%. The left ventricle has normal function. The left ventricle has no regional wall motion abnormalities. The left ventricular internal cavity size was normal in size. There is  no left ventricular hypertrophy. Left ventricular diastolic parameters are consistent with Grade I diastolic dysfunction (impaired relaxation). Normal left ventricular filling pressure. Right Ventricle: The right ventricular size is normal. No increase in right  ventricular wall thickness. Right ventricular systolic function is normal. There is normal pulmonary artery systolic pressure. The tricuspid regurgitant velocity is 2.32 m/s, and  with an assumed right atrial pressure of 8 mmHg, the estimated right ventricular systolic pressure is 29.5 mmHg. Left Atrium: Left atrial size was normal in size. Right Atrium: Right atrial size was normal in size. Pericardium: There is no evidence of pericardial effusion. Mitral Valve: The mitral valve is normal in structure. No evidence of mitral valve regurgitation. No evidence of mitral valve stenosis. Tricuspid Valve: The tricuspid valve is normal in structure. Tricuspid valve regurgitation is trivial. No evidence of tricuspid stenosis. Aortic Valve: The aortic valve is normal in structure. Aortic valve regurgitation is not visualized. No aortic stenosis is present. Aortic valve peak gradient measures 12.1 mmHg. Pulmonic Valve: The pulmonic valve was normal in structure. Pulmonic valve regurgitation is not visualized. No evidence of pulmonic stenosis. Aorta: The aortic root is normal in size and structure. Venous: The inferior vena cava is dilated in size with greater than 50% respiratory variability, suggesting right atrial pressure of 8 mmHg. IAS/Shunts: No atrial level shunt detected by color flow Doppler.  LEFT VENTRICLE PLAX 2D LVIDd:         5.10 cm   Diastology LVIDs:         3.10 cm   LV e' medial:    9.79 cm/s LV PW:         0.90 cm   LV E/e' medial:  6.8 LV IVS:        0.70 cm   LV e' lateral:   11.40 cm/s LVOT diam:     1.90 cm   LV E/e' lateral: 5.8 LV SV:         79 LV SV Index:   39 LVOT Area:     2.84 cm                           3D Volume EF:                          3D EF:  69 %                          LV EDV:       157 ml                          LV ESV:       49 ml                          LV SV:        108 ml RIGHT VENTRICLE             IVC RV S prime:     15.10 cm/s  IVC diam: 2.10 cm TAPSE (M-mode): 1.6  cm LEFT ATRIUM             Index        RIGHT ATRIUM           Index LA diam:        3.60 cm 1.76 cm/m   RA Area:     14.10 cm LA Vol (A2C):   24.2 ml 11.80 ml/m  RA Volume:   33.50 ml  16.34 ml/m LA Vol (A4C):   37.0 ml 18.04 ml/m LA Biplane Vol: 33.1 ml 16.14 ml/m  AORTIC VALVE AV Area (Vmax): 2.39 cm AV Vmax:        174.00 cm/s AV Peak Grad:   12.1 mmHg LVOT Vmax:      146.67 cm/s LVOT Vmean:     95.633 cm/s LVOT VTI:       0.279 m  AORTA Ao Root diam: 3.10 cm Ao Asc diam:  2.90 cm MITRAL VALVE                TRICUSPID VALVE MV Area (PHT): 4.17 cm     TR Peak grad:   21.5 mmHg MV Decel Time: 182 msec     TR Vmax:        232.00 cm/s MV E velocity: 66.50 cm/s MV A velocity: 100.00 cm/s  SHUNTS MV E/A ratio:  0.66         Systemic VTI:  0.28 m                             Systemic Diam: 1.90 cm Armanda Magic MD Electronically signed by Armanda Magic MD Signature Date/Time: 02/02/2023/4:31:08 PM    Final    CT Head Wo Contrast  Result Date: 02/02/2023 CLINICAL DATA:  Syncope. EXAM: CT HEAD WITHOUT CONTRAST TECHNIQUE: Contiguous axial images were obtained from the base of the skull through the vertex without intravenous contrast. RADIATION DOSE REDUCTION: This exam was performed according to the departmental dose-optimization program which includes automated exposure control, adjustment of the mA and/or kV according to patient size and/or use of iterative reconstruction technique. COMPARISON:  06/10/2010. FINDINGS: Brain: No acute intracranial hemorrhage, midline shift or mass effect. No extra-axial fluid collection. Gray-white matter differentiation is within normal limits. No hydrocephalus. Vascular: No hyperdense vessel or unexpected calcification. Skull: Normal. Negative for fracture or focal lesion. Sinuses/Orbits: Mild mucosal thickening in the ethmoid air cells on the right. No acute abnormality. Other: None. IMPRESSION: No acute intracranial process. Electronically Signed   By: Thornell Sartorius M.D.   On:  02/02/2023 01:50   DG Chest Portable 1 View  Result Date: 02/02/2023 CLINICAL DATA:  Syncope, bradycardia EXAM: PORTABLE CHEST 1 VIEW COMPARISON:  None Available. FINDINGS: The heart size and mediastinal contours are within normal limits. Both lungs are clear. The visualized skeletal structures are unremarkable. IMPRESSION: No active disease. Electronically Signed   By: Helyn Numbers M.D.   On: 02/02/2023 01:41    Cardiac Studies     Patient Profile     57 y.o. male with a hx of anxiety disorder, hypertension, tobacco use who presents with syncope  Assessment & Plan    Recurrent syncopal episodes with bradycardia Patient with new onset of  2 witnessed and 1 unwitnessed episodes of syncope precipitated by prodrome (lightheadedness) and postictal phase (incontinence and confusion).  Per EMS heart rates have dropped to the low 20s and required atropine with response back to the 70s.  Description suggests vasovagal syncope, as describes prodromal symptoms prior to passing out.  Given incontinence and confusion afterwards, seizures also on differential.  Echocardiogram shows no structural abnormalities.  Cardiac telemetry unrevealing.  Neurowork-up has been unremarkable - Will plan live Zio patch x 2 weeks on discharge -Discussed [facility] regulations that needs to be free of syncope x 6 months to drive   Hypertension Historically patient has had uncontrolled hypertension and has recently been put on benazepril.  Currently holding due to soft blood pressures now.   Tobacco use  Currently smokes about a pack per day.    [facility] will sign off.   Medication Recommendations: None Other recommendations (labs, testing, etc): Live Zio patch x 2 weeks.  No driving x 6 months Follow up as an outpatient: We will schedule   For questions or updates, please contact [facility] Please consult [email or web address] for contact info under        Signed, Little Ishikawa, MD  02/03/2023, 10:49 AM

## 2023-02-15 ENCOUNTER — Encounter: Payer: Self-pay | Admitting: Student

## 2023-02-15 ENCOUNTER — Ambulatory Visit (INDEPENDENT_AMBULATORY_CARE_PROVIDER_SITE_OTHER): Payer: PRIVATE HEALTH INSURANCE | Admitting: Student

## 2023-02-15 ENCOUNTER — Other Ambulatory Visit: Payer: Self-pay

## 2023-02-15 VITALS — BP 132/76 | HR 71 | Temp 97.7°F | Ht 70.0 in | Wt 198.1 lb

## 2023-02-15 DIAGNOSIS — I10 Essential (primary) hypertension: Secondary | ICD-10-CM

## 2023-02-15 DIAGNOSIS — E782 Mixed hyperlipidemia: Secondary | ICD-10-CM

## 2023-02-15 DIAGNOSIS — F17210 Nicotine dependence, cigarettes, uncomplicated: Secondary | ICD-10-CM | POA: Diagnosis not present

## 2023-02-15 DIAGNOSIS — F419 Anxiety disorder, unspecified: Secondary | ICD-10-CM

## 2023-02-15 DIAGNOSIS — F411 Generalized anxiety disorder: Secondary | ICD-10-CM | POA: Diagnosis not present

## 2023-02-15 DIAGNOSIS — R55 Syncope and collapse: Secondary | ICD-10-CM

## 2023-02-15 DIAGNOSIS — Z0000 Encounter for general adult medical examination without abnormal findings: Secondary | ICD-10-CM | POA: Insufficient documentation

## 2023-02-15 DIAGNOSIS — R001 Bradycardia, unspecified: Secondary | ICD-10-CM

## 2023-02-15 DIAGNOSIS — F17200 Nicotine dependence, unspecified, uncomplicated: Secondary | ICD-10-CM

## 2023-02-15 MED ORDER — SERTRALINE HCL 25 MG PO TABS
25.0000 mg | ORAL_TABLET | Freq: Every day | ORAL | 11 refills | Status: AC
Start: 2023-02-15 — End: 2024-02-15

## 2023-02-15 MED ORDER — VARENICLINE TARTRATE (STARTER) 0.5 MG X 11 & 1 MG X 42 PO TBPK
ORAL_TABLET | ORAL | 0 refills | Status: DC
Start: 2023-02-15 — End: 2023-02-15

## 2023-02-15 MED ORDER — VARENICLINE TARTRATE (STARTER) 0.5 MG X 11 & 1 MG X 42 PO TBPK: ORAL_TABLET | ORAL | 0 refills | Status: DC

## 2023-02-15 MED ORDER — HYDROXYZINE HCL 25 MG PO TABS
25.0000 mg | ORAL_TABLET | Freq: Three times a day (TID) | ORAL | 2 refills | Status: AC | PRN
Start: 2023-02-15 — End: ?

## 2023-02-15 NOTE — Assessment & Plan Note (Signed)
Pt has a a 30 pack year smoking history. States he still smokes about a pack a day. He has tried to quit in the past with NRT. He is interested in quitting today. Recommended Chantix which pt is agreeable to.   > Chantix

## 2023-02-15 NOTE — Assessment & Plan Note (Addendum)
Pt presents for follow up after recent syncopal event. States he was at home 2 weeks ago when he felt lightheaded and weak before passing out. EMS was called and patient passed out an additional 2 times en route to the hospital. He was admitted for unexplained syncope with bradycardia and had negative cardiac and neurologic work up including EEG, head CT and MRI, EKG, echo, stress test, orthostatics. Etiology found to be likely vasovagal in nature. He was sent home with Zio patch and is being followed by cardiology. States since his discharge, he has had no further syncopal events but has had episodes of palpitations, shortness of breath, and dizziness that lasts 5-10 minutes. These episodes do no occur everyday. Given negative work up thus far, vasovagal syncope is most like etiology. Pt did have an episode of urinary incontinence during one of the episodes so cannot rule out seizure. However this does seem less likely given his presentation and absence of postictal state. Will follow up with Zio patch at this time. Further, patient has normal TSH with low AM cortisol on the lower side during his admission. Will repeat to rule out adrenal pathology.   > follow up with Zio patch > AM cortisol

## 2023-02-15 NOTE — Assessment & Plan Note (Signed)
Pt has a longstanding history of anxiety which he feels has worsened since he was discharged from the hospital several weeks ago. States he gets panic attacks throughout the day and often fears being alone. His anxiety stems from concerns about his health. He does feel his symptoms are impairing his ability to function. He has had difficulty sleeping due to racing thoughts. Has also had decrease in his appetite. GAD-7 score was 20 on exam. He does not endorse any associated depression or suicidal ideations. States he was on clonidine and xanax several years ago but did not like the sedating effects. Will start him on daily Zoloft and hydroxyzine as needed for sleep and panic attacks.   > Zoloft 25 mg daily > Hydroxyzine 25 mg TID as needed

## 2023-02-15 NOTE — Assessment & Plan Note (Addendum)
Pt has history of hypertension. BP today was good at 132/76. He was previously on Losartan, however this was discontinued several weeks ago during his hospital admission. Given his blood pressure is well controlled and he is pending further syncope work up, will continue to hold losartan. Will obtain A1c and lipid panel for further work up of ASCVD risk factors. Additionally, will assess kidney function and blood count.  >A1c > lipid panel  > CBC > BMP

## 2023-02-15 NOTE — Assessment & Plan Note (Signed)
Patient has never received a colonoscopy. He is agreeable to begin screening, however will hold off on this until after his cardiac work up is complete.

## 2023-02-15 NOTE — Progress Notes (Unsigned)
Subjective:   Patient ID: Aaron [name] male   DOB: 07-07-1966 57 y.o.   MRN: [id number]  HPI: [name] is a 57 y.o. male with a PMHx as listed below who presents for follow up after recent hospitalization for syncopal event as well as to establish care. Please see problem based assessment and plan for further work up and management.   Past Medical History:  Diagnosis Date   Anxiety    Hypertension    Rotator cuff injury 02/18/2013   Right side, untreated   Past Surgical History:  Procedure Laterality Date   HERNIA REPAIR     MULTIPLE TOOTH EXTRACTIONS     Family History  Problem Relation Age of Onset   Lung cancer Father      Patient Active Problem List   Diagnosis Date Noted   Anxiety 02/15/2023   Healthcare maintenance 02/15/2023   Syncope 02/02/2023   Bradycardia 02/02/2023   Primary hypertension 02/02/2023   Major depression, recurrent (HCC) 02/19/2013   PTSD (post-traumatic stress disorder) 02/19/2013   Alcohol abuse 02/19/2013   TOBACCO DEPENDENCE 12/08/2006   DJD, UNSPECIFIED 12/08/2006     Current Outpatient Medications  Medication Sig Dispense Refill   hydrOXYzine (ATARAX) 25 MG tablet Take 1 tablet (25 mg total) by mouth 3 (three) times daily as needed. 30 tablet 2   sertraline (ZOLOFT) 25 MG tablet Take 1 tablet (25 mg total) by mouth daily. 30 tablet 11   losartan (COZAAR) 25 MG tablet Take 25 mg by mouth daily.     Potassium 99 MG TABS Take 99 mg by mouth daily.     Varenicline Tartrate, Starter, (CHANTIX STARTING MONTH PAK) 0.5 MG X 11 & 1 MG X 42 TBPK Day 1 to 3: 0.5 mg once a day Day 4 to 7: 0.5 mg twice dayly Day 8 and later: 1 mg twice daily 1 each 0   No current facility-administered medications for this visit.     Review of Systems: Pertinent ROS listed in the A&P. Otherwise negative.  Objective:   Physical Exam: Vitals:   02/15/23 1421  BP: 132/76  Pulse: 71  Temp: 97.7 F (36.5 C)  TempSrc: Oral  SpO2: 99%  Weight: 198  lb 1.6 oz (89.9 kg)  Height: 5\' 10"  (1.778 m)   Physical Exam Constitutional:      Appearance: Normal appearance.  HENT:     Head: Normocephalic and atraumatic.     Right Ear: Tympanic membrane normal.     Left Ear: Tympanic membrane normal.     Mouth/Throat:     Mouth: Mucous membranes are moist.     Pharynx: Oropharynx is clear.  Eyes:     Conjunctiva/sclera: Conjunctivae normal.  Cardiovascular:     Rate and Rhythm: Normal rate and regular rhythm.  Pulmonary:     Effort: Pulmonary effort is normal.     Breath sounds: Normal breath sounds.  Skin:    General: Skin is warm and dry.  Neurological:     General: No focal deficit present.     Mental Status: He is alert and oriented to person, place, and time.  Psychiatric:        Mood and Affect: Mood normal.        Behavior: Behavior normal.      Assessment & Plan:   Syncope Pt presents for follow up after recent syncopal event. States he was at home 2 weeks ago when he felt lightheaded and weak before  passing out. EMS was called and patient passed out an additional 2 times en route to the hospital. He was admitted for unexplained syncope with bradycardia and had negative cardiac and neurologic work up including EEG, head CT and MRI, EKG, echo, stress test, orthostatics. Etiology found to be likely vasovagal in nature. He was sent home with Zio patch and is being followed by cardiology. States since his discharge, he has had no further syncopal events but has had episodes of palpitations, shortness of breath, and dizziness that lasts 5-10 minutes. These episodes do no occur everyday. Given negative work up thus far, vasovagal syncope is most like etiology. Pt did have an episode of urinary incontinence during one of the episodes so cannot rule out seizure. However this does seem less likely given his presentation and absence of postictal state. Will follow up with Zio patch at this time. Further, patient has normal TSH with low AM  cortisol on the lower side during his admission. Will repeat to rule out adrenal pathology.   > follow up with Zio patch > AM cortisol  Primary hypertension Pt has history of hypertension. BP today was good at 132/76. He was previously on Losartan, however this was discontinued several weeks ago during his hospital admission. Given his blood pressure is well controlled and he is pending further syncope work up, will continue to hold losartan. Will obtain A1c and lipid panel for further work up of ASCVD risk factors. Additionally, will assess kidney function and blood count.  >A1c > lipid panel  > CBC > BMP  TOBACCO DEPENDENCE Pt has a a 30 pack year smoking history. States he still smokes about a pack a day. He has tried to quit in the past with NRT. He is interested in quitting today. Recommended Chantix which pt is agreeable to.   > Chantix    Anxiety Pt has a longstanding history of anxiety which he feels has worsened since he was discharged from the hospital several weeks ago. States he gets panic attacks throughout the day and often fears being alone. His anxiety stems from concerns about his health. He does feel his symptoms are impairing his ability to function. He has had difficulty sleeping due to racing thoughts. Has also had decrease in his appetite. GAD-7 score was 20 on exam. He does not endorse any associated depression or suicidal ideations. States he was on clonidine and xanax several years ago but did not like the sedating effects. Will start him on daily Zoloft and hydroxyzine as needed for sleep and panic attacks.   > Zoloft 25 mg daily > Hydroxyzine 25 mg TID as needed  Healthcare maintenance Patient has never received a colonoscopy. He is agreeable to begin screening, however will hold off on this until after his cardiac work up is complete.

## 2023-02-15 NOTE — Patient Instructions (Addendum)
Mr. Creager,  It was nice seeing you in the clinic today.  Here is a summary what we talked about:  1.  Please follow-up with your cardiologist for the result of your heart monitor.  2.  Please continue holding your losartan.  We will check your kidney function, blood count, cholesterol and diabetic number.  3.  Please start taking Chantix as instructed for tobacco cessation  4.  I started you on Zoloft and hydroxyzine as needed to help with your anxiety.  Please allow 4 to 6 weeks for Zoloft to start working.  5.  If you can, please come back at any day next week around 8 AM to have a repeat cortisol test.  Please follow-up in 3 months, sooner if needed  Dr. Cyndie Chime

## 2023-02-16 LAB — CBC
Hematocrit: 44 % (ref 37.5–51.0)
Hemoglobin: 14.9 g/dL (ref 13.0–17.7)
MCHC: 33.9 g/dL (ref 31.5–35.7)
RDW: 12.3 % (ref 11.6–15.4)
WBC: 8.1 10*3/uL (ref 3.4–10.8)

## 2023-02-16 LAB — LIPID PANEL

## 2023-02-16 LAB — BMP8+ANION GAP

## 2023-02-16 NOTE — Progress Notes (Signed)
Internal Medicine Clinic Attending  Case discussed with Dr. Nguyen  At the time of the visit.  We reviewed the resident's history and exam and pertinent patient test results.  I agree with the assessment, diagnosis, and plan of care documented in the resident's note. 

## 2023-02-17 LAB — BMP8+ANION GAP
Anion Gap: 16 mmol/L (ref 10.0–18.0)
BUN/Creatinine Ratio: 10 (ref 9–20)
BUN: 10 mg/dL (ref 6–24)
CO2: 21 mmol/L (ref 20–29)
Chloride: 108 mmol/L — ABNORMAL HIGH (ref 96–106)
Glucose: 89 mg/dL (ref 70–99)
Potassium: 4 mmol/L (ref 3.5–5.2)
Sodium: 145 mmol/L — ABNORMAL HIGH (ref 134–144)
eGFR: 88 mL/min/{1.73_m2} (ref >59)

## 2023-02-17 LAB — LIPID PANEL
Chol/HDL Ratio: 3.6 ratio (ref 0.0–5.0)
Cholesterol, Total: 156 mg/dL (ref 100–199)
LDL Chol Calc (NIH): 82 mg/dL (ref 0–99)
VLDL Cholesterol Cal: 31 mg/dL (ref 5–40)

## 2023-02-17 LAB — CBC
MCH: 32.6 pg (ref 26.6–33.0)
MCV: 96 fL (ref 79–97)
Platelets: 235 10*3/uL (ref 150–450)
RBC: 4.57 x10E6/uL (ref 4.14–5.80)

## 2023-02-18 DIAGNOSIS — E785 Hyperlipidemia, unspecified: Secondary | ICD-10-CM | POA: Insufficient documentation

## 2023-02-18 LAB — HGB A1C W/O EAG: Hgb A1c MFr Bld: 5.3 % (ref 4.8–5.6)

## 2023-02-18 LAB — SPECIMEN STATUS REPORT

## 2023-02-18 MED ORDER — ROSUVASTATIN CALCIUM 10 MG PO TABS
10.0000 mg | ORAL_TABLET | Freq: Every day | ORAL | 11 refills | Status: DC
Start: 2023-02-18 — End: 2023-05-13

## 2023-02-18 NOTE — Addendum Note (Signed)
Addended byDoran Stabler on: 02/18/2023 08:50 AM   Modules accepted: Orders

## 2023-02-18 NOTE — Assessment & Plan Note (Signed)
10 years ASCVD risk 10-12%.  Patient agrees with starting statin.  -Start Crestor 10 mg daily.  Counseled patient on the side effects

## 2023-03-07 NOTE — Progress Notes (Unsigned)
Cardiology Office Note:    Date:  03/08/2023   ID:  Aaron [name], DOB 29-Nov-1965, MRN [id number]  PCP:  Pcp, No  Cardiologist:  Little Ishikawa, MD  Electrophysiologist:  None   Referring MD: No ref. provider found   Chief Complaint  Patient presents with   Loss of Consciousness    History of Present Illness:    Aaron [name] is a 57 y.o. male with a hx of anxiety, hypertension, tobacco use who presents for follow-up.  He was admitted 01/2023 with syncope.  He reported that he was watching TV and started to feel lightheaded.  He remembered he was being woken up by his daughter.  He had an episode of incontinence and was confused for about 5 minutes after the incident.  EMS was called and on arrival he had another syncopal episode.  Per EMS heart rate dropped to the 20s during episode.  He was given atropine with improvement in heart rate to 70s.  Echocardiogram 02/02/2023 showed normal biventricular function, no significant valvular disease.  Brain MRI and EEG were unremarkable.  He was discharged with cardiac monitor.  Since discharge from the hospital, he reports he has been doing okay.  Reports has had some lightheadedness but denies any syncope.  Did have some palpitations while wearing monitor.  He denies any chest pain or dyspnea.  Reports some lower extremity edema after being on feet all day.  He has cut back from 1 to 0.5 packs/day.  Past Medical History:  Diagnosis Date   Anxiety    Hypertension    Rotator cuff injury 02/18/2013   Right side, untreated    Past Surgical History:  Procedure Laterality Date   HERNIA REPAIR     MULTIPLE TOOTH EXTRACTIONS      Current Medications: Current Meds  Medication Sig   hydrOXYzine (ATARAX) 25 MG tablet Take 1 tablet (25 mg total) by mouth 3 (three) times daily as needed.   Potassium 99 MG TABS Take 99 mg by mouth daily.   rosuvastatin (CRESTOR) 10 MG tablet Take 1 tablet (10 mg total) by mouth daily.   sertraline (ZOLOFT) 25  MG tablet Take 1 tablet (25 mg total) by mouth daily.   Varenicline Tartrate, Starter, (CHANTIX STARTING MONTH PAK) 0.5 MG X 11 & 1 MG X 42 TBPK Day 1 to 3: 0.5 mg once a day Day 4 to 7: 0.5 mg twice dayly Day 8 and later: 1 mg twice daily   [DISCONTINUED] losartan (COZAAR) 25 MG tablet Take 25 mg by mouth daily.     Allergies:   Patient has no known allergies.   Social History   Socioeconomic History   Marital status: Widowed    Spouse name: Not on file   Number of children: Not on file   Years of education: Not on file   Highest education level: Not on file  Occupational History   Not on file  Tobacco Use   Smoking status: Every Day    Packs/day: 1.00    Years: 30.00    Additional pack years: 0.00    Total pack years: 30.00    Types: Cigarettes   Smokeless tobacco: Never  Vaping Use   Vaping Use: Never used  Substance and Sexual Activity   Alcohol use: Yes    Comment: rarely   Drug use: Yes    Frequency: 1.0 times per week    Types: Marijuana   Sexual activity: Yes    Birth control/protection: None  Other Topics Concern   Not on file  Social History Narrative   Not on file   Social Determinants of Health   Financial Resource Strain: Not on file  Food Insecurity: No Food Insecurity (02/02/2023)   Hunger Vital Sign    Worried About Running Out of Food in the Last Year: Never true    Ran Out of Food in the Last Year: Never true  Transportation Needs: No Transportation Needs (02/02/2023)   PRAPARE - Administrator, Civil Service (Medical): No    Lack of Transportation (Non-Medical): No  Physical Activity: Not on file  Stress: Not on file  Social Connections: Not on file     Family History: The patient's family history includes Lung cancer in his father.  ROS:   Please see the history of present illness.     All other systems reviewed and are negative.  EKGs/Labs/Other Studies Reviewed:    The following studies were reviewed today:   EKG:    03/08/2023: Normal sinus rhythm, rate 67, no ST abnormalities, QTc 412  Recent Labs: 02/01/2023: ALT 22 02/02/2023: TSH 1.827 02/15/2023: BUN 10; Creatinine, Ser 1.00; Hemoglobin 14.9; Platelets 235; Potassium 4.0; Sodium 145  Recent Lipid Panel    Component Value Date/Time   CHOL 156 02/15/2023 1548   TRIG 179 (H) 02/15/2023 1548   HDL 43 02/15/2023 1548   CHOLHDL 3.6 02/15/2023 1548   CHOLHDL 4.3 02/19/2013 0635   VLDL 34 02/19/2013 0635   LDLCALC 82 02/15/2023 1548    Physical Exam:    VS:  BP 138/74   Pulse 67   Ht 5' 10.5" (1.791 m)   Wt 191 lb 12.8 oz (87 kg)   SpO2 99%   BMI 27.13 kg/m     Wt Readings from Last 3 Encounters:  03/08/23 191 lb 12.8 oz (87 kg)  02/15/23 198 lb 1.6 oz (89.9 kg)  02/03/23 190 lb 11.2 oz (86.5 kg)     GEN:  Well nourished, well developed in no acute distress HEENT: Normal NECK: No JVD; No carotid bruits LYMPHATICS: No lymphadenopathy CARDIAC: RRR, no murmurs, rubs, gallops RESPIRATORY:  Clear to auscultation without rales, wheezing or rhonchi  ABDOMEN: Soft, non-tender, non-distended MUSCULOSKELETAL:  No edema; No deformity  SKIN: Warm and dry NEUROLOGIC:  Alert and oriented x 3 PSYCHIATRIC:  Normal affect   ASSESSMENT:    1. Syncope, unspecified syncope type   2. Hyperlipidemia, unspecified hyperlipidemia type   3. Essential hypertension   4. Tobacco use    PLAN:    Syncope: Patient with new onset of  2 witnessed and 1 unwitnessed episodes of syncope precipitated by prodrome (lightheadedness) and postictal phase (incontinence and confusion).  Per EMS heart rates have dropped to the low 20s and required atropine with response back to the 70s.  Description suggests vasovagal syncope, as describes prodromal symptoms prior to passing out.  Given incontinence and confusion afterwards, seizures also on differential.  Echocardiogram shows no structural abnormalities.  Cardiac telemetry unrevealing.  Neuro work-up  unremarkable -Follow-up Zio patch x 2 weeks -Discussed [facility] regulations that needs to be free of syncope x 6 months to drive  Hypertension: Was on benazepril but discontinued during hospitalization 01/2023 due to soft BP.  Currently appears normotensive off antihypertensives  Tobacco use: Counseled risk of tobacco use and cessation strongly encouraged.  He has started Chantix  Hyperlipidemia: Started on rosuvastatin 10 mg daily 02/2023, LDL 82.  Check calcium score to guide how aggressive  to be in lowering cholesterol  RTC in 3 months  Medication Adjustments/Labs and Tests Ordered: Current medicines are reviewed at length with the patient today.  Concerns regarding medicines are outlined above.  Orders Placed This Encounter  Procedures   CT CARDIAC SCORING (SELF PAY ONLY)   EKG 12-Lead   No orders of the defined types were placed in this encounter.   Patient Instructions  Medication Instructions:  Your physician recommends that you continue on your current medications as directed. Please refer to the Current Medication list given to you today.   *If you need a refill on your cardiac medications before your next appointment, please call your pharmacy*  Testing/Procedures: CT coronary calcium score.   Test locations:  [facility]  [facility] at [facility]  This is $99 out of pocket.   Coronary CalciumScan A coronary calcium scan is an imaging test used to look for deposits of calcium and other fatty materials (plaques) in the inner lining of the blood vessels of the heart (coronary arteries). These deposits of calcium and plaques can partly clog and narrow the coronary arteries without producing any symptoms or warning signs. This puts a person at risk for a heart attack. This test can detect these deposits before symptoms develop. Tell a health care provider about: Any allergies you  have. All medicines you are taking, including vitamins, herbs, eye drops, creams, and over-the-counter medicines. Any problems you or family members have had with anesthetic medicines. Any blood disorders you have. Any surgeries you have had. Any medical conditions you have. Whether you are pregnant or may be pregnant. What are the risks? Generally, this is a safe procedure. However, problems may occur, including: Harm to a pregnant woman and her unborn baby. This test involves the use of radiation. Radiation exposure can be dangerous to a pregnant woman and her unborn baby. If you are pregnant, you generally should not have this procedure done. Slight increase in the risk of cancer. This is because of the radiation involved in the test. What happens before the procedure? No preparation is needed for this procedure. What happens during the procedure? You will undress and remove any jewelry around your neck or chest. You will put on a hospital gown. Sticky electrodes will be placed on your chest. The electrodes will be connected to an electrocardiogram (ECG) machine to record a tracing of the electrical activity of your heart. A CT scanner will take pictures of your heart. During this time, you will be asked to lie still and hold your breath for 2-3 seconds while a picture of your heart is being taken. The procedure may vary among health care providers and hospitals. What happens after the procedure? You can get dressed. You can return to your normal activities. It is up to you to get the results of your test. Ask your health care provider, or the department that is doing the test, when your results will be ready. Summary A coronary calcium scan is an imaging test used to look for deposits of calcium and other fatty materials (plaques) in the inner lining of the blood vessels of the heart (coronary arteries). Generally, this is a safe procedure. Tell your health care provider if you are  pregnant or may be pregnant. No preparation is needed for this procedure. A CT scanner will take pictures of your heart. You can return to your normal activities after the scan is done. This information  is not intended to replace advice given to you by your health care provider. Make sure you discuss any questions you have with your health care provider. Document Released: 03/25/2008 Document Revised: 08/16/2016 Document Reviewed: 08/16/2016 Elsevier Interactive Patient Education  2017 [employer].  Follow-Up: At [facility], you and your health needs are our priority.  As part of our continuing mission to provide you with exceptional heart care, we have created designated Provider Care Teams.  These Care Teams include your primary Cardiologist (physician) and Advanced Practice Providers (APPs -  Physician Assistants and Nurse Practitioners) who all work together to provide you with the care you need, when you need it.  We recommend signing up for the patient portal called "MyChart".  Sign up information is provided on this After Visit Summary.  MyChart is used to connect with patients for Virtual Visits (Telemedicine).  Patients are able to view lab/test results, encounter notes, upcoming appointments, etc.  Non-urgent messages can be sent to your provider as well.   To learn more about what you can do with MyChart, go to [email or web address].    Your next appointment:   3 month(s)  Provider:   Little Ishikawa, MD        Signed, Little Ishikawa, MD  03/08/2023 9:06 AM    [facility]

## 2023-03-08 ENCOUNTER — Encounter: Payer: Self-pay | Admitting: Cardiology

## 2023-03-08 ENCOUNTER — Ambulatory Visit: Payer: PRIVATE HEALTH INSURANCE | Attending: Cardiology | Admitting: Cardiology

## 2023-03-08 VITALS — BP 138/74 | HR 67 | Ht 70.5 in | Wt 191.8 lb

## 2023-03-08 DIAGNOSIS — Z720 Tobacco use: Secondary | ICD-10-CM

## 2023-03-08 DIAGNOSIS — E785 Hyperlipidemia, unspecified: Secondary | ICD-10-CM | POA: Diagnosis not present

## 2023-03-08 DIAGNOSIS — R55 Syncope and collapse: Secondary | ICD-10-CM | POA: Diagnosis not present

## 2023-03-08 DIAGNOSIS — I10 Essential (primary) hypertension: Secondary | ICD-10-CM | POA: Diagnosis not present

## 2023-03-08 NOTE — Patient Instructions (Signed)
Medication Instructions:  Your physician recommends that you continue on your current medications as directed. Please refer to the Current Medication list given to you today.   *If you need a refill on your cardiac medications before your next appointment, please call your pharmacy*  Testing/Procedures: CT coronary calcium score.   Test locations:  [facility]  [facility] [facility] at [facility]  This is $99 out of pocket.   Coronary CalciumScan A coronary calcium scan is an imaging test used to look for deposits of calcium and other fatty materials (plaques) in the inner lining of the blood vessels of the heart (coronary arteries). These deposits of calcium and plaques can partly clog and narrow the coronary arteries without producing any symptoms or warning signs. This puts a person at risk for a heart attack. This test can detect these deposits before symptoms develop. Tell a health care provider about: Any allergies you have. All medicines you are taking, including vitamins, herbs, eye drops, creams, and over-the-counter medicines. Any problems you or family members have had with anesthetic medicines. Any blood disorders you have. Any surgeries you have had. Any medical conditions you have. Whether you are pregnant or may be pregnant. What are the risks? Generally, this is a safe procedure. However, problems may occur, including: Harm to a pregnant woman and her unborn baby. This test involves the use of radiation. Radiation exposure can be dangerous to a pregnant woman and her unborn baby. If you are pregnant, you generally should not have this procedure done. Slight increase in the risk of cancer. This is because of the radiation involved in the test. What happens before the procedure? No preparation is needed for this procedure. What happens during the procedure? You will undress and remove any jewelry around  your neck or chest. You will put on a hospital gown. Sticky electrodes will be placed on your chest. The electrodes will be connected to an electrocardiogram (ECG) machine to record a tracing of the electrical activity of your heart. A CT scanner will take pictures of your heart. During this time, you will be asked to lie still and hold your breath for 2-3 seconds while a picture of your heart is being taken. The procedure may vary among health care providers and hospitals. What happens after the procedure? You can get dressed. You can return to your normal activities. It is up to you to get the results of your test. Ask your health care provider, or the department that is doing the test, when your results will be ready. Summary A coronary calcium scan is an imaging test used to look for deposits of calcium and other fatty materials (plaques) in the inner lining of the blood vessels of the heart (coronary arteries). Generally, this is a safe procedure. Tell your health care provider if you are pregnant or may be pregnant. No preparation is needed for this procedure. A CT scanner will take pictures of your heart. You can return to your normal activities after the scan is done. This information is not intended to replace advice given to you by your health care provider. Make sure you discuss any questions you have with your health care provider. Document Released: 03/25/2008 Document Revised: 08/16/2016 Document Reviewed: 08/16/2016 Elsevier Interactive Patient Education  2017 [employer].  Follow-Up: At [facility], you and your health needs are our priority.  As part of our continuing mission to provide you with exceptional heart  care, we have created designated Provider Care Teams.  These Care Teams include your primary Cardiologist (physician) and Advanced Practice Providers (APPs -  Physician Assistants and Nurse Practitioners) who all work together to provide you with the care you  need, when you need it.  We recommend signing up for the patient portal called "MyChart".  Sign up information is provided on this After Visit Summary.  MyChart is used to connect with patients for Virtual Visits (Telemedicine).  Patients are able to view lab/test results, encounter notes, upcoming appointments, etc.  Non-urgent messages can be sent to your provider as well.   To learn more about what you can do with MyChart, go to [email or web address].    Your next appointment:   3 month(s)  Provider:   Little Ishikawa, MD

## 2023-03-11 NOTE — Addendum Note (Signed)
Encounter addended by: Andee Lineman A on: 03/11/2023 11:44 AM  Actions taken: Imaging Exam ended

## 2023-03-31 ENCOUNTER — Ambulatory Visit (INDEPENDENT_AMBULATORY_CARE_PROVIDER_SITE_OTHER): Payer: PRIVATE HEALTH INSURANCE | Admitting: Internal Medicine

## 2023-03-31 ENCOUNTER — Encounter: Payer: Self-pay | Admitting: Internal Medicine

## 2023-03-31 ENCOUNTER — Telehealth: Payer: Self-pay

## 2023-03-31 VITALS — BP 153/82 | HR 75 | Temp 98.0°F | Ht 70.0 in | Wt 189.2 lb

## 2023-03-31 DIAGNOSIS — B9689 Other specified bacterial agents as the cause of diseases classified elsewhere: Secondary | ICD-10-CM | POA: Diagnosis not present

## 2023-03-31 DIAGNOSIS — J019 Acute sinusitis, unspecified: Secondary | ICD-10-CM | POA: Diagnosis not present

## 2023-03-31 DIAGNOSIS — J329 Chronic sinusitis, unspecified: Secondary | ICD-10-CM

## 2023-03-31 MED ORDER — GUAIFENESIN-CODEINE 100-10 MG/5ML PO SYRP
5.0000 mL | ORAL_SOLUTION | Freq: Three times a day (TID) | ORAL | 0 refills | Status: DC | PRN
Start: 2023-03-31 — End: 2023-04-30

## 2023-03-31 MED ORDER — GUAIFENESIN-CODEINE 100-10 MG/5ML PO SYRP
5.0000 mL | ORAL_SOLUTION | Freq: Three times a day (TID) | ORAL | 0 refills | Status: DC | PRN
Start: 2023-03-31 — End: 2023-03-31

## 2023-03-31 MED ORDER — AMOXICILLIN-POT CLAVULANATE 875-125 MG PO TABS
1.0000 | ORAL_TABLET | Freq: Two times a day (BID) | ORAL | 0 refills | Status: DC
Start: 2023-03-31 — End: 2023-03-31

## 2023-03-31 MED ORDER — AMOXICILLIN-POT CLAVULANATE 875-125 MG PO TABS
1.0000 | ORAL_TABLET | Freq: Two times a day (BID) | ORAL | 0 refills | Status: DC
Start: 2023-03-31 — End: 2023-04-30

## 2023-03-31 NOTE — Patient Instructions (Signed)
Thank you, Mr.Treyshon [employer] for allowing [place] to provide your care today.   Infection I have sent in augmentin, take this 2 times daily for 5 days. The send medication I sent was cough syrup with codeine and guifenesin which is ingredient in mucinex. Please use that at night to help with cough.  I have ordered the following medication/changed the following medications:   Stop the following medications: Medications Discontinued During This Encounter  Medication Reason   Varenicline Tartrate, Starter, (CHANTIX STARTING MONTH PAK) 0.5 MG X 11 & 1 MG X 42 TBPK      Start the following medications: No orders of the defined types were placed in this encounter.   We look forward to seeing you next time. Please call our clinic at [phone] if you have any questions or concerns. The best time to call is Monday-Friday from 9am-4pm, but there is someone available 24/7. If after hours or the weekend, call the main hospital number and ask for the Internal Medicine Resident On-Call. If you need medication refills, please notify your pharmacy one week in advance and they will send [place] a request.   Thank you for trusting me with your care. Wishing you the best!   Rudene Christians, DO [facility]

## 2023-03-31 NOTE — Telephone Encounter (Signed)
Return call to pt who stated he has been feeling bad more than 1 week. Prod cough - yellowish phlegm. Fever off and on - highest 101.3.  He has tried OTC meds - Tylenol  and  Tylenol cold & flu - nothing helping. Appt today 6/20 with Dr Sloan Leiter at 1345PM.

## 2023-03-31 NOTE — Telephone Encounter (Signed)
Pt requesting a call back about having a Fever, Cough, Sweating and just not feeling well X 1 week.  Pt has been taking OTC medication and it is not working.  Pt also states sometime he is getting winded and lightheaded on occasion.   Pt is aware no appts are available today but would still like a call.

## 2023-03-31 NOTE — Progress Notes (Signed)
Subjective:  CC: sore throat, cough  HPI:  Aaron [name] is a 57 y.o. male with a past medical history stated below and presents today for sore throat, congestion and cough that started on June 12th.  His daughter and son-in-law had had similar symptoms. His cough is productive and worst at night making it difficult to sleep. He is having fevers, most recently on Tuesday at 101.41F.  He is taking tylenol severe cold and flu as well as ibuprofen without improvement in symptoms. Denies ear pain but is having muscles aches. Please see problem based assessment and plan for additional details.  Past Medical History:  Diagnosis Date   Anxiety    Hypertension    Rotator cuff injury 02/18/2013   Right side, untreated    Current Outpatient Medications on File Prior to Visit  Medication Sig Dispense Refill   hydrOXYzine (ATARAX) 25 MG tablet Take 1 tablet (25 mg total) by mouth 3 (three) times daily as needed. 30 tablet 2   Potassium 99 MG TABS Take 99 mg by mouth daily.     rosuvastatin (CRESTOR) 10 MG tablet Take 1 tablet (10 mg total) by mouth daily. 30 tablet 11   sertraline (ZOLOFT) 25 MG tablet Take 1 tablet (25 mg total) by mouth daily. 30 tablet 11   No current facility-administered medications on file prior to visit.    Family History  Problem Relation Age of Onset   Lung cancer Father     Social History   Socioeconomic History   Marital status: Widowed    Spouse name: Not on file   Number of children: Not on file   Years of education: Not on file   Highest education level: Not on file  Occupational History   Not on file  Tobacco Use   Smoking status: Every Day    Packs/day: 1.00    Years: 30.00    Additional pack years: 0.00    Total pack years: 30.00    Types: Cigarettes   Smokeless tobacco: Never  Vaping Use   Vaping Use: Never used  Substance and Sexual Activity   Alcohol use: Yes    Comment: rarely   Drug use: Yes    Frequency: 1.0 times per week     Types: Marijuana   Sexual activity: Yes    Birth control/protection: None  Other Topics Concern   Not on file  Social History Narrative   Not on file   Social Determinants of Health   Financial Resource Strain: Not on file  Food Insecurity: No Food Insecurity (02/02/2023)   Hunger Vital Sign    Worried About Running Out of Food in the Last Year: Never true    Ran Out of Food in the Last Year: Never true  Transportation Needs: No Transportation Needs (02/02/2023)   PRAPARE - Administrator, Civil Service (Medical): No    Lack of Transportation (Non-Medical): No  Physical Activity: Not on file  Stress: Not on file  Social Connections: Not on file  Intimate Partner Violence: Not At Risk (02/02/2023)   Humiliation, Afraid, Rape, and Kick questionnaire    Fear of Current or Ex-Partner: No    Emotionally Abused: No    Physically Abused: No    Sexually Abused: No    Review of Systems: ROS negative except for what is noted on the assessment and plan.  Objective:   Vitals:   03/31/23 1423  BP: (!) 153/82  Pulse: 75  Temp: 98 F (36.7 C)  TempSrc: Oral  SpO2: 98%  Weight: 189 lb 3.2 oz (85.8 kg)  Height: 5\' 10"  (1.778 m)    Physical Exam: Constitutional: fatigued appearing HENT: erythema of pharynx, no effusion of TM bilaterally, sinus pressure without tenderness Eyes: conjunctiva non-erythematous Cardiovascular: regular rate and rhythm, no m/r/g Pulmonary/Chest: normal work of breathing on room air, lungs clear to auscultation bilaterally  Assessment & Plan:  Sinusitis With duration of symptoms greater than 1 week, will proceed with treatment for bacterial sinusitis. His cough is improving, but he has difficulty resting due to nocturnal symptoms. -augmentin BID for 5 days. -codeine cough syrup    Patient discussed with Dr. Percell Boston Remas Sobel, D.O. [facility]  PGY-2 Pager: [phone]  Phone: [phone] Date 04/01/2023  Time  10:24 AM

## 2023-04-01 DIAGNOSIS — J329 Chronic sinusitis, unspecified: Secondary | ICD-10-CM | POA: Insufficient documentation

## 2023-04-01 NOTE — Assessment & Plan Note (Addendum)
With duration of symptoms greater than 1 week, will proceed with treatment for bacterial sinusitis. His cough is improving, but he has difficulty resting due to nocturnal symptoms. -augmentin BID for 5 days. -codeine cough syrup

## 2023-04-05 NOTE — Progress Notes (Signed)
Internal Medicine Clinic Attending  Case discussed with Dr. Masters  at the time of the visit.  We reviewed the resident's history and exam and pertinent patient test results.  I agree with the assessment, diagnosis, and plan of care documented in the resident's note.  

## 2023-04-26 ENCOUNTER — Inpatient Hospital Stay (HOSPITAL_COMMUNITY)
Admission: EM | Admit: 2023-04-26 | Discharge: 2023-04-30 | DRG: 351 | Disposition: A | Discharge status: Home / Self Care | Payer: BLUE CROSS/BLUE SHIELD | Source: Ambulatory Visit | Attending: Internal Medicine | Admitting: Internal Medicine

## 2023-04-26 ENCOUNTER — Telehealth: Payer: Self-pay | Admitting: *Deleted

## 2023-04-26 ENCOUNTER — Encounter (HOSPITAL_COMMUNITY): Payer: Self-pay

## 2023-04-26 ENCOUNTER — Emergency Department (HOSPITAL_COMMUNITY): Payer: BLUE CROSS/BLUE SHIELD

## 2023-04-26 ENCOUNTER — Encounter: Payer: Self-pay | Admitting: Student

## 2023-04-26 ENCOUNTER — Ambulatory Visit (INDEPENDENT_AMBULATORY_CARE_PROVIDER_SITE_OTHER): Payer: BLUE CROSS/BLUE SHIELD | Admitting: Student

## 2023-04-26 ENCOUNTER — Other Ambulatory Visit: Payer: Self-pay

## 2023-04-26 VITALS — BP 134/89 | HR 77 | Temp 98.0°F | Resp 24 | Ht 70.0 in | Wt 190.7 lb

## 2023-04-26 DIAGNOSIS — F17210 Nicotine dependence, cigarettes, uncomplicated: Secondary | ICD-10-CM | POA: Diagnosis present

## 2023-04-26 DIAGNOSIS — E785 Hyperlipidemia, unspecified: Secondary | ICD-10-CM | POA: Diagnosis present

## 2023-04-26 DIAGNOSIS — L03314 Cellulitis of groin: Secondary | ICD-10-CM | POA: Diagnosis present

## 2023-04-26 DIAGNOSIS — K4090 Unilateral inguinal hernia, without obstruction or gangrene, not specified as recurrent: Secondary | ICD-10-CM | POA: Diagnosis not present

## 2023-04-26 DIAGNOSIS — K760 Fatty (change of) liver, not elsewhere classified: Secondary | ICD-10-CM | POA: Diagnosis present

## 2023-04-26 DIAGNOSIS — F411 Generalized anxiety disorder: Secondary | ICD-10-CM | POA: Diagnosis present

## 2023-04-26 DIAGNOSIS — I10 Essential (primary) hypertension: Secondary | ICD-10-CM | POA: Diagnosis present

## 2023-04-26 DIAGNOSIS — K4030 Unilateral inguinal hernia, with obstruction, without gangrene, not specified as recurrent: Secondary | ICD-10-CM | POA: Insufficient documentation

## 2023-04-26 DIAGNOSIS — Z79899 Other long term (current) drug therapy: Secondary | ICD-10-CM

## 2023-04-26 DIAGNOSIS — F419 Anxiety disorder, unspecified: Secondary | ICD-10-CM | POA: Diagnosis present

## 2023-04-26 DIAGNOSIS — R1032 Left lower quadrant pain: Secondary | ICD-10-CM | POA: Diagnosis not present

## 2023-04-26 DIAGNOSIS — R233 Spontaneous ecchymoses: Secondary | ICD-10-CM | POA: Diagnosis not present

## 2023-04-26 DIAGNOSIS — Q631 Lobulated, fused and horseshoe kidney: Secondary | ICD-10-CM

## 2023-04-26 HISTORY — DX: Hyperlipidemia, unspecified: E78.5

## 2023-04-26 HISTORY — DX: Syncope and collapse: R55

## 2023-04-26 LAB — CBC WITH DIFFERENTIAL/PLATELET
Abs Immature Granulocytes: 0.03 10*3/uL (ref 0.00–0.07)
Basophils Absolute: 0.1 10*3/uL (ref 0.0–0.1)
Basophils Relative: 1 %
Eosinophils Absolute: 0.2 10*3/uL (ref 0.0–0.5)
Eosinophils Relative: 2 %
HCT: 41.1 % (ref 39.0–52.0)
Hemoglobin: 13.6 g/dL (ref 13.0–17.0)
Immature Granulocytes: 0 %
Lymphocytes Relative: 24 %
Lymphs Abs: 2.2 10*3/uL (ref 0.7–4.0)
MCH: 31.3 pg (ref 26.0–34.0)
MCHC: 33.1 g/dL (ref 30.0–36.0)
MCV: 94.7 fL (ref 80.0–100.0)
Monocytes Absolute: 1 10*3/uL (ref 0.1–1.0)
Monocytes Relative: 10 %
Neutro Abs: 6 10*3/uL (ref 1.7–7.7)
Neutrophils Relative %: 63 %
Platelets: 233 10*3/uL (ref 150–400)
RBC: 4.34 MIL/uL (ref 4.22–5.81)
RDW: 12.5 % (ref 11.5–15.5)
WBC: 9.4 10*3/uL (ref 4.0–10.5)
nRBC: 0 % (ref 0.0–0.2)

## 2023-04-26 LAB — BASIC METABOLIC PANEL
Anion gap: 9 (ref 5–15)
BUN: 17 mg/dL (ref 6–20)
CO2: 24 mmol/L (ref 22–32)
Calcium: 8.8 mg/dL — ABNORMAL LOW (ref 8.9–10.3)
Chloride: 105 mmol/L (ref 98–111)
Creatinine, Ser: 1.14 mg/dL (ref 0.61–1.24)
GFR, Estimated: >60 mL/min (ref >60)
Glucose, Bld: 94 mg/dL (ref 70–99)
Potassium: 3.8 mmol/L (ref 3.5–5.1)
Sodium: 138 mmol/L (ref 135–145)

## 2023-04-26 LAB — HEPATIC FUNCTION PANEL
ALT: 22 U/L (ref 0–44)
AST: 20 U/L (ref 15–41)
Albumin: 3.1 g/dL — ABNORMAL LOW (ref 3.5–5.0)
Alkaline Phosphatase: 36 U/L — ABNORMAL LOW (ref 38–126)
Bilirubin, Direct: <0.1 mg/dL (ref 0.0–0.2)
Total Bilirubin: 0.5 mg/dL (ref 0.3–1.2)
Total Protein: 6 g/dL — ABNORMAL LOW (ref 6.5–8.1)

## 2023-04-26 MED ORDER — HYDROMORPHONE HCL 1 MG/ML IJ SOLN
0.5000 mg | Freq: Four times a day (QID) | INTRAMUSCULAR | Status: DC | PRN
  Administered 2023-04-27 – 2023-04-29 (×8): 1 mg via INTRAVENOUS
  Filled 2023-04-26 (×10): qty 1

## 2023-04-26 MED ORDER — ACETAMINOPHEN 650 MG RE SUPP: 650.0000 mg | Freq: Four times a day (QID) | RECTAL | Status: DC | PRN

## 2023-04-26 MED ORDER — ACETAMINOPHEN 325 MG PO TABS: 650.0000 mg | ORAL_TABLET | Freq: Four times a day (QID) | ORAL | Status: DC | PRN

## 2023-04-26 MED ORDER — MORPHINE SULFATE (PF) 4 MG/ML IV SOLN
4.0000 mg | Freq: Once | INTRAVENOUS | Status: AC
  Administered 2023-04-26: 4 mg via INTRAVENOUS
  Filled 2023-04-26: qty 1

## 2023-04-26 MED ORDER — OXYCODONE HCL 5 MG PO TABS
5.0000 mg | ORAL_TABLET | ORAL | Status: DC | PRN
  Administered 2023-04-26 – 2023-04-29 (×8): 5 mg via ORAL
  Filled 2023-04-26 (×8): qty 1

## 2023-04-26 MED ORDER — ONDANSETRON HCL 4 MG/2ML IJ SOLN
4.0000 mg | Freq: Once | INTRAMUSCULAR | Status: AC
  Administered 2023-04-26: 4 mg via INTRAVENOUS
  Filled 2023-04-26: qty 2

## 2023-04-26 MED ORDER — IOHEXOL 350 MG/ML SOLN
75.0000 mL | Freq: Once | INTRAVENOUS | Status: AC | PRN
  Administered 2023-04-26: 75 mL via INTRAVENOUS

## 2023-04-26 NOTE — Progress Notes (Signed)
CC: Swelling in the left inguinal area  HPI:  [name] is a 57 y.o. male living with a history stated below and presents today for swelling in the left inguinal area. Please see problem based assessment and plan for additional details.  Past Medical History:  Diagnosis Date   Anxiety    Hypertension    Rotator cuff injury 02/18/2013   Right side, untreated    Current Outpatient Medications on File Prior to Visit  Medication Sig Dispense Refill   amoxicillin-clavulanate (AUGMENTIN) 875-125 MG tablet Take 1 tablet by mouth 2 (two) times daily. 10 tablet 0   guaiFENesin-codeine (ROBITUSSIN AC) 100-10 MG/5ML syrup Take 5 mLs by mouth 3 (three) times daily as needed for cough. 120 mL 0   hydrOXYzine (ATARAX) 25 MG tablet Take 1 tablet (25 mg total) by mouth 3 (three) times daily as needed. 30 tablet 2   Potassium 99 MG TABS Take 99 mg by mouth daily.     rosuvastatin (CRESTOR) 10 MG tablet Take 1 tablet (10 mg total) by mouth daily. 30 tablet 11   sertraline (ZOLOFT) 25 MG tablet Take 1 tablet (25 mg total) by mouth daily. 30 tablet 11   No current facility-administered medications on file prior to visit.    Family History  Problem Relation Age of Onset   Lung cancer Father     Social History   Socioeconomic History   Marital status: Widowed    Spouse name: Not on file   Number of children: Not on file   Years of education: Not on file   Highest education level: Not on file  Occupational History   Not on file  Tobacco Use   Smoking status: Every Day    Current packs/day: 0.40    Average packs/day: 0.4 packs/day for 30.0 years (12.0 ttl pk-yrs)    Types: Cigarettes   Smokeless tobacco: Never   Tobacco comments:    5 per day  Vaping Use   Vaping status: Never Used  Substance and Sexual Activity   Alcohol use: Yes    Comment: rarely   Drug use: Yes    Frequency: 1.0 times per week    Types: Marijuana   Sexual activity: Yes    Birth control/protection: None   Other Topics Concern   Not on file  Social History Narrative   Not on file   Social Determinants of Health   Financial Resource Strain: Not on file  Food Insecurity: No Food Insecurity (02/02/2023)   Hunger Vital Sign    Worried About Running Out of Food in the Last Year: Never true    Ran Out of Food in the Last Year: Never true  Transportation Needs: No Transportation Needs (02/02/2023)   PRAPARE - Administrator, Civil Service (Medical): No    Lack of Transportation (Non-Medical): No  Physical Activity: Not on file  Stress: Not on file  Social Connections: Not on file  Intimate Partner Violence: Not At Risk (02/02/2023)   Humiliation, Afraid, Rape, and Kick questionnaire    Fear of Current or Ex-Partner: No    Emotionally Abused: No    Physically Abused: No    Sexually Abused: No    Review of Systems: ROS negative except for what is noted on the assessment and plan.  Vitals:   04/26/23 0849  BP: 134/89  Pulse: 77  Resp: (!) 24  Temp: 98 F (36.7 C)  TempSrc: Oral  SpO2: 97%  Weight: 190 lb  11.2 oz (86.5 kg)  Height: 5\' 10"  (1.778 m)    Physical Exam:  Constitutional: Well-appearing man in mild distress,answers approprietly to questions,looked stated age . GU: Nonreducible bulge in the left groin.  Appropriately healed scar tissue present in the inguinal area indicative of prior surgery  Assessment & Plan:   Inguinal hernia, incarcerated 57 year old male presenting due to concerns for swelling in the lower left part of the inguinal region, says started about 2 days ago and the swelling has been steadily increasing, reports an associated pain that gets worse with sitting up from laying down and also trying to lace his shoes in the morning.  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  Patient has a history of inguinal hernia for  which surgical intervention with mesh was done about 23 years ago, says this feels like the same pain he had in the past.  On exam , non reducible bulge in the left groin was appreciated.  Slowly  applying pressure to the distal part bulge while guiding the  proximal portion of the abdomen through the fascial defect did not seem to reduce the swelling .Pt seems to be  pain during this maneuver.Considering a nonreducible incarcerated hernia at this time.  Will send the patient to an ED for further evaluation and  potential general surgery consult.   Patient seen with Dr. Joycelyn Das, M.D [facility] Phone: [phone] Date 04/26/2023 Time 9:58 AM

## 2023-04-26 NOTE — Assessment & Plan Note (Addendum)
57 year old male presenting due to concerns for swelling in the lower left part of the inguinal region, says started about 2 days ago and the swelling has been steadily increasing, reports an associated pain that gets worse with sitting up from laying down and also trying to lace his shoes in the morning.  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  Patient has a history of inguinal hernia for which surgical intervention with mesh was done about 23 years ago, says this feels like the same pain he had in the past.  On exam , non reducible bulge in the left groin was appreciated.  Slowly  applying pressure to the distal part bulge while guiding the  proximal portion of the abdomen through the fascial defect did not seem to reduce the swelling .Pt seems to be  pain during this maneuver.Considering a nonreducible incarcerated hernia at this time.  Will send the patient to an ED for further evaluation and  potential general surgery consult.

## 2023-04-26 NOTE — ED Notes (Signed)
Patient transported to CT 

## 2023-04-26 NOTE — ED Notes (Addendum)
ED TO INPATIENT HANDOFF REPORT  ED Nurse Name and Phone #: Juliette Alcide RN [phone]  S Name/Age/Gender Aaron [name] 57 y.o. male Room/Bed: 042C/042C  Code Status   Code Status: Full Code  Home/SNF/Other Home Patient oriented to: self, place, time, and situation Is this baseline? Yes   Triage Complete: Triage complete  Chief Complaint Inguinal hernia with incarceration [K40.30]  Triage Note Pt c/o L inguinal hernia causing pain; tried to reduce at PCP, unsuccessful; sent to ed for further evaluation; hx L hernia repair 23 years ago   Allergies No Known Allergies  Level of Care/Admitting Diagnosis ED Disposition     ED Disposition  Admit   Condition  --   Comment  Hospital Area: [facility] [[id number]]  Level of Care: Med-Surg [16]  May place patient in observation at [facility] or [facility] if equivalent level of care is available:: No  Covid Evaluation: Asymptomatic - no recent exposure (last 10 days) testing not required  Diagnosis: Inguinal hernia with incarceration [454098]  Admitting Physician: Dickie La [[id number]]  Attending Physician: Dickie La [[id number]]          B Medical/Surgery History Past Medical History:  Diagnosis Date   Anxiety    Hypertension    Rotator cuff injury 02/18/2013   Right side, untreated   Past Surgical History:  Procedure Laterality Date   HERNIA REPAIR     MULTIPLE TOOTH EXTRACTIONS       A IV Location/Drains/Wounds Patient Lines/Drains/Airways Status     Active Line/Drains/Airways     Name Placement date Placement time Site Days   Peripheral IV 04/26/23 20 G Anterior;Proximal;Right Forearm 04/26/23  1616  Forearm  less than 1            Intake/Output Last 24 hours No intake or output data in the 24 hours ending 04/26/23 2113  Labs/Imaging Results for orders placed or performed during the hospital encounter of 04/26/23 (from the past 48 hour(s))  Basic metabolic panel     Status: Abnormal    Collection Time: 04/26/23  3:03 PM  Result Value Ref Range   Sodium 138 135 - 145 mmol/L   Potassium 3.8 3.5 - 5.1 mmol/L   Chloride 105 98 - 111 mmol/L   CO2 24 22 - 32 mmol/L   Glucose, Bld 94 70 - 99 mg/dL    Comment: Glucose reference range applies only to samples taken after fasting for at least 8 hours.   BUN 17 6 - 20 mg/dL   Creatinine, Ser 0.86 0.61 - 1.24 mg/dL   Calcium 8.8 (L) 8.9 - 10.3 mg/dL   GFR, Estimated >57 >84 mL/min    Comment: (NOTE) Calculated using the CKD-EPI Creatinine Equation (2021)    Anion gap 9 5 - 15    Comment: Performed at [facility], [address], [place], [place] [address]  CBC with Differential     Status: None   Collection Time: 04/26/23  3:03 PM  Result Value Ref Range   WBC 9.4 4.0 - 10.5 K/uL   RBC 4.34 4.22 - 5.81 MIL/uL   Hemoglobin 13.6 13.0 - 17.0 g/dL   HCT 52.8 41.3 - 24.4 %   MCV 94.7 80.0 - 100.0 fL   MCH 31.3 26.0 - 34.0 pg   MCHC 33.1 30.0 - 36.0 g/dL   RDW 01.0 27.2 - 53.6 %   Platelets 233 150 - 400 K/uL   nRBC 0.0 0.0 - 0.2 %   Neutrophils  Relative % 63 %   Neutro Abs 6.0 1.7 - 7.7 K/uL   Lymphocytes Relative 24 %   Lymphs Abs 2.2 0.7 - 4.0 K/uL   Monocytes Relative 10 %   Monocytes Absolute 1.0 0.1 - 1.0 K/uL   Eosinophils Relative 2 %   Eosinophils Absolute 0.2 0.0 - 0.5 K/uL   Basophils Relative 1 %   Basophils Absolute 0.1 0.0 - 0.1 K/uL   Immature Granulocytes 0 %   Abs Immature Granulocytes 0.03 0.00 - 0.07 K/uL    Comment: Performed at [facility], [address], [place], [place] [address]  Hepatic function panel     Status: Abnormal   Collection Time: 04/26/23  4:31 PM  Result Value Ref Range   Total Protein 6.0 (L) 6.5 - 8.1 g/dL   Albumin 3.1 (L) 3.5 - 5.0 g/dL   AST 20 15 - 41 U/L   ALT 22 0 - 44 U/L   Alkaline Phosphatase 36 (L) 38 - 126 U/L   Total Bilirubin 0.5 0.3 - 1.2 mg/dL   Bilirubin, Direct <1.9 0.0 - 0.2 mg/dL   Indirect Bilirubin NOT CALCULATED 0.3 - 0.9 mg/dL     Comment: Performed at [facility], [address] [place], [place] [address]   CT ABDOMEN PELVIS W CONTRAST  Result Date: 04/26/2023 CLINICAL DATA:  Left lower quadrant pain EXAM: CT ABDOMEN AND PELVIS WITH CONTRAST TECHNIQUE: Multidetector CT imaging of the abdomen and pelvis was performed using the standard protocol following bolus administration of intravenous contrast. RADIATION DOSE REDUCTION: This exam was performed according to the departmental dose-optimization program which includes automated exposure control, adjustment of the mA and/or kV according to patient size and/or use of iterative reconstruction technique. CONTRAST:  75mL OMNIPAQUE IOHEXOL 350 MG/ML SOLN COMPARISON:  CT 11/23/2021 FINDINGS: Lower chest: No acute abnormality. Hepatobiliary: Hepatic steatosis. No calcified gallstone or biliary dilatation Pancreas: Unremarkable. No pancreatic ductal dilatation or surrounding inflammatory changes. Spleen: Normal in size without focal abnormality. Adrenals/Urinary Tract: Adrenal glands are normal. Horseshoe kidney. No hydronephrosis. Bladder is normal Stomach/Bowel: Stomach is within normal limits. Appendix appears normal. No evidence of bowel wall thickening, distention, or inflammatory changes. Vascular/Lymphatic: Mild aortic atherosclerosis. No aneurysm. No suspicious lymph nodes. Reproductive: Prostate unremarkable Other: Negative for pelvic effusion or free air. Moderate fat stranding at the left inguinal region. Small fat containing right inguinal hernia. Moderate fat containing left inguinal hernia with stranding in the hernia sac. Musculoskeletal: No acute or suspicious osseous abnormality IMPRESSION: 1. Moderate fat containing left inguinal hernia with stranding in the hernia sac and at the left inguinal region, findings suspicious for incarcerated fat containing left inguinal hernia. 2. Horseshoe kidney. 3. Hepatic steatosis. 4. Aortic atherosclerosis. Aortic Atherosclerosis  (ICD10-I70.0). Electronically Signed   By: Jasmine Pang M.D.   On: 04/26/2023 18:08    Pending Labs Unresulted Labs (From admission, onward)     Start     Ordered   04/27/23 0500  Basic metabolic panel  Tomorrow morning,   R        04/26/23 2050   04/27/23 0500  CBC  Tomorrow morning,   R        04/26/23 2050   Pending  SARS Coronavirus 2 by RT PCR (hospital order, performed in [facility] hospital lab) *cepheid single result test* Anterior Nasal Swab  (Tier 2 - SARS Coronavirus 2 by RT PCR (hospital order, performed in [facility] hospital lab) *cepheid single result test*)  Once,  R        Pending            Vitals/Pain Today's Vitals   04/26/23 1715 04/26/23 1832 04/26/23 1835 04/26/23 1925  BP: 129/73 (!) 154/82    Pulse: 64 64    Resp: 19 17    Temp:  98.8 F (37.1 C)    TempSrc:  Oral    SpO2: 94% 97%    PainSc:   7  3     Isolation Precautions No active isolations  Medications Medications  acetaminophen (TYLENOL) tablet 650 mg (has no administration in time range)    Or  acetaminophen (TYLENOL) suppository 650 mg (has no administration in time range)  oxyCODONE (Oxy IR/ROXICODONE) immediate release tablet 5 mg (has no administration in time range)  HYDROmorphone (DILAUDID) injection 0.5-1 mg (has no administration in time range)  ondansetron (ZOFRAN) injection 4 mg (4 mg Intravenous Given 04/26/23 1616)  morphine (PF) 4 MG/ML injection 4 mg (4 mg Intravenous Given 04/26/23 1617)  iohexol (OMNIPAQUE) 350 MG/ML injection 75 mL (75 mLs Intravenous Contrast Given 04/26/23 1729)  morphine (PF) 4 MG/ML injection 4 mg (4 mg Intravenous Given 04/26/23 1833)    Mobility walks     Focused Assessments Cardiac Assessment Handoff:    No results found for: "CKTOTAL", "CKMB", "CKMBINDEX", "TROPONINI" No results found for: "DDIMER" Does the Patient currently have chest pain? No   , Neuro Assessment Handoff:  Swallow screen pass? Yes          Neuro Assessment:   WDL   R Recommendations: See Admitting Provider Note  Report given to:   Additional Notes: Pt A&Ox4. Ambulatory. Continent. Pt being admitted for incarcerated inguinal hernia. Surgery needed but currently unplanned.

## 2023-04-26 NOTE — H&P (Incomplete)
Date: 04/26/2023               Patient Name:  Aaron [name] MRN: [id number]  DOB: [birth year] Age / Sex: 57 y.o., male   PCP: Rudene Christians, DO         Medical Service: Internal Medicine Teaching Service         Attending Physician: Dr. Dickie La, MD      First Contact: Dr. Laretta Bolster, MD Pager [phone]    Second Contact: Dr. Elza Rafter, DO Pager [phone]         After Hours (After 5p/  First Contact Pager: [phone]  weekends / holidays): Second Contact Pager: [phone]   SUBJECTIVE   Chief Complaint: Left groin pain  History of Present Illness:   Aaron [name] is a 57 y.o. who was sent to the ED by his PCP due to concerns for an incarcerated hernia. Aaron [name] states that he has had a pinching pain on the left groin since Sunday but it has gradually gotten worse, enlarging, and becoming red on Monday. It worsens when he leans forward or stands up. Improves when laying down. OTC meds have not helped. Rates the pain a 9/10 without morphine (given at the ED). He has been able to pass gas and has had bowel movements but he had to strain and work harder than usual to get it through. This was very painful. Denies any N/V, fevers, difficulty urinating. Of note, he notes he has had a R hernia, but it presented differently with scrotal swelling 23 years ago. 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 Several providers have attempted to reduce the hernia with no success.   ED Course: At the ED, a CT Ab/pel with contrast was obtained which showed:  Hepatic steatosis  Horseshoe kidneys  No evidence of bowel wall thickening, distention, or inflammatory changes.  No pelvic free air  Fat stranding on the left inguinal region  Fat containing right inguinal hernia with stranding in the hernia sac.   Surgery was consulted. He was suggested ambulatory surgery, but he was nauseated and in pain despite two rounds of morphine and being on Trendelenburg for 2 hours. He requested to be admitted for potential  surgery in the next few days (OR is at capacity).  Past Medical History:  Diagnosis Date   Anxiety    Hyperlipidemia    Hypertension    Rotator cuff injury 02/18/2013   Right side, untreated   Past Surgical History:  Procedure Laterality Date   HERNIA REPAIR     MULTIPLE TOOTH EXTRACTIONS      No Known Allergies  Meds:  Current Meds  Medication Sig   hydrOXYzine (ATARAX) 25 MG tablet Take 1 tablet (25 mg total) by mouth 3 (three) times daily as needed. (Patient taking differently: Take 25 mg by mouth 3 (three) times daily as needed for anxiety or itching.)   Potassium 99 MG TABS Take 99 mg by mouth daily.   sertraline (ZOLOFT) 25 MG tablet Take 1 tablet (25 mg total) by mouth daily.   Used to be on a statin but not currently taking it.   Family History:   Family History  Problem Relation Age of Onset   Lung cancer Father    Parkinson's disease Sister     Social History:  Lives With: daugther (28).  Occupation: Maintenance at [employer] Support: Family Level of Function: Able to do all his ADLS and ILDLs. PCP: Rudene Christians, MD Substances: He smokes  5-6 cigarettes daily, used to smoke 1-1.5 PPD and has smoked for last ~40 years. Denies alcohol use except every so often socially (every few months), has occasional marijuana use but no other illicit substance use.   Review of Systems: A complete ROS was negative except as per HPI.   OBJECTIVE:   Physical Exam: Blood pressure (!) 154/82, pulse 64, temperature 98.8 F (37.1 C), temperature source Oral, resp. rate 17, SpO2 97%.  Constitutional: well-appearing sitting comfortably in bed in no acute distress. HENT: normocephalic atraumatic, mucous membranes moist Eyes: conjunctiva non-erythematous Neck: supple Cardiovascular: regular rate and rhythm, no m/r/g Pulmonary/Chest: normal work of breathing on room air, lungs clear to auscultation bilaterally Abdominal: soft, non-distended, guarding +, tender to palpation on the  lower left and middle quadrants. Negative Rovsing or Murphys sign. On the left inguinal groin, there is a round non reducible non-fluctuating erythematous mass that is tender to palpation. MSK: normal bulk and tone Neurological: alert & oriented x 3 Skin: warm and dry  Labs: CBC    Component Value Date/Time   WBC 9.4 04/26/2023 1503   RBC 4.34 04/26/2023 1503   HGB 13.6 04/26/2023 1503   HGB 14.9 02/15/2023 1548   HCT 41.1 04/26/2023 1503   HCT 44.0 02/15/2023 1548   PLT 233 04/26/2023 1503   PLT 235 02/15/2023 1548   MCV 94.7 04/26/2023 1503   MCV 96 02/15/2023 1548   MCH 31.3 04/26/2023 1503   MCHC 33.1 04/26/2023 1503   RDW 12.5 04/26/2023 1503   RDW 12.3 02/15/2023 1548   LYMPHSABS 2.2 04/26/2023 1503   MONOABS 1.0 04/26/2023 1503   EOSABS 0.2 04/26/2023 1503   BASOSABS 0.1 04/26/2023 1503     CMP     Component Value Date/Time   NA 138 04/26/2023 1503   NA 145 (H) 02/15/2023 1548   K 3.8 04/26/2023 1503   CL 105 04/26/2023 1503   CO2 24 04/26/2023 1503   GLUCOSE 94 04/26/2023 1503   BUN 17 04/26/2023 1503   BUN 10 02/15/2023 1548   CREATININE 1.14 04/26/2023 1503   CALCIUM 8.8 (L) 04/26/2023 1503   PROT 6.0 (L) 04/26/2023 1631   ALBUMIN 3.1 (L) 04/26/2023 1631   AST 20 04/26/2023 1631   ALT 22 04/26/2023 1631   ALKPHOS 36 (L) 04/26/2023 1631   BILITOT 0.5 04/26/2023 1631   GFRNONAA >60 04/26/2023 1503   GFRAA >90 02/19/2013 0635    Imaging:  CT ABDOMEN PELVIS W CONTRAST  Result Date: 04/26/2023 CLINICAL DATA:  Left lower quadrant pain EXAM: CT ABDOMEN AND PELVIS WITH CONTRAST TECHNIQUE: Multidetector CT imaging of the abdomen and pelvis was performed using the standard protocol following bolus administration of intravenous contrast. RADIATION DOSE REDUCTION: This exam was performed according to the departmental dose-optimization program which includes automated exposure control, adjustment of the mA and/or kV according to patient size and/or use of  iterative reconstruction technique. CONTRAST:  75mL OMNIPAQUE IOHEXOL 350 MG/ML SOLN COMPARISON:  CT 11/23/2021 FINDINGS: Lower chest: No acute abnormality. Hepatobiliary: Hepatic steatosis. No calcified gallstone or biliary dilatation Pancreas: Unremarkable. No pancreatic ductal dilatation or surrounding inflammatory changes. Spleen: Normal in size without focal abnormality. Adrenals/Urinary Tract: Adrenal glands are normal. Horseshoe kidney. No hydronephrosis. Bladder is normal Stomach/Bowel: Stomach is within normal limits. Appendix appears normal. No evidence of bowel wall thickening, distention, or inflammatory changes. Vascular/Lymphatic: Mild aortic atherosclerosis. No aneurysm. No suspicious lymph nodes. Reproductive: Prostate unremarkable Other: Negative for pelvic effusion or free air. Moderate fat stranding at  the left inguinal region. Small fat containing right inguinal hernia. Moderate fat containing left inguinal hernia with stranding in the hernia sac. Musculoskeletal: No acute or suspicious osseous abnormality IMPRESSION: 1. Moderate fat containing left inguinal hernia with stranding in the hernia sac and at the left inguinal region, findings suspicious for incarcerated fat containing left inguinal hernia. 2. Horseshoe kidney. 3. Hepatic steatosis. 4. Aortic atherosclerosis. Aortic Atherosclerosis (ICD10-I70.0). Electronically Signed   By: Jasmine Pang M.D.   On: 04/26/2023 18:08      ASSESSMENT & PLAN:   Assessment & Plan by Problem: Principal Problem:   Inguinal hernia with incarceration   Aaron [name] is a 57 y.o. presents with a bulging non-reducible erythematous and tender mass under the inguinal ligament. He has a prior history of a hernia in that region, but states that it presented with scrotal swelling treated with a mesh placement 23 years ago. Aaron [name] is in significant pain and given his presentation he should be getting a repair soon given risk of strangulation. He has no  leukocytosis or fevers.  #Incarcerated Hernia (Left)  General surgery consulted, appreciate their recommendations. Given location it is likely a femoral hernia. OR at [facility] is at capacity due to high census of emergency cases. Surgery advised outpatient follow-up with plan for elective repair but he is in significant pain limiting activity. Thus, the patient would like to get surgery either here, if an OR becomes available, or as a transfer.  -NPO at midnight  #GAD  Hold hydroxyzine 25mg  TID PRN for anxiety (he takes about 1 a day)- took 1 7/17 AM.  Hold home dose of Sertraline (25mg  daily) until post-op if 7/17, or given if surgery is beyond that point.  #Hyperlipidemia  10 years ASCVD risk 10-12% Was prescribed rosuvastatin 10mg  daily but does not currently take it.  -Consider starting rosuvastatin.  #History of Hypokalemia  #History of Leg cramping  -On K (2.3meq) OTC pills at home. K 4.3 today. Holding dose.  -Monitor K   #Hepatic Steatosis  FU with GI outpatient    Diet: Normal will need to be NPO at midnight. VTE: None IVF: None Code: Full  Prior to Admission Living Arrangement: Home Anticipated Discharge Location: Home  Dispo: Admit patient to Inpatient with expected length of stay greater than 2 midnights.  Signed: [facility]  Internal Medicine Resident, PGY-1 [facility]  Pager: [phone]  04/26/2023, 9:18 PM

## 2023-04-26 NOTE — Consult Note (Addendum)
Surgical Evaluation Requesting provider: Claude Manges PA-C  Chief Complaint: left groin pain  HPI: Very pleasant 57 year old male with history of open left inguinal hernia repair with mesh 23 years ago who presents with left groin pain and swelling.  He initially noted pain in the left groin 2 days ago which was positional and related to certain movements such as sitting up from lying down and bending over to put on his shoes.  Yesterday he then developed significant swelling in the left groin and increased pain.  He was evaluated at his primary care physician office where an attempt was made but not successful to reduce the hernia and subsequently referred here to [facility] ER for further evaluation.  He has not had any obstructive symptoms nor urinary symptoms, but does note that the pain is exacerbated by straining to have a bowel movement.  He is a Consulting civil engineer at [employer].  He does smoke 5 cigarettes a day which is decreased from a pack a day after recent hospitalization for syncopal episode.   No Known Allergies  Past Medical History:  Diagnosis Date   Anxiety    Hypertension    Rotator cuff injury 02/18/2013   Right side, untreated    Past Surgical History:  Procedure Laterality Date   HERNIA REPAIR     MULTIPLE TOOTH EXTRACTIONS      Family History  Problem Relation Age of Onset   Lung cancer Father     Social History   Socioeconomic History   Marital status: Widowed    Spouse name: Not on file   Number of children: Not on file   Years of education: Not on file   Highest education level: Not on file  Occupational History   Not on file  Tobacco Use   Smoking status: Every Day    Current packs/day: 0.40    Average packs/day: 0.4 packs/day for 30.0 years (12.0 ttl pk-yrs)    Types: Cigarettes   Smokeless tobacco: Never   Tobacco comments:    5 per day  Vaping Use   Vaping status: Never Used  Substance and Sexual Activity   Alcohol use: Yes    Comment:  rarely   Drug use: Yes    Frequency: 1.0 times per week    Types: Marijuana   Sexual activity: Yes    Birth control/protection: None  Other Topics Concern   Not on file  Social History Narrative   Not on file   Social Determinants of Health   Financial Resource Strain: Not on file  Food Insecurity: No Food Insecurity (02/02/2023)   Hunger Vital Sign    Worried About Running Out of Food in the Last Year: Never true    Ran Out of Food in the Last Year: Never true  Transportation Needs: No Transportation Needs (02/02/2023)   PRAPARE - Administrator, Civil Service (Medical): No    Lack of Transportation (Non-Medical): No  Physical Activity: Not on file  Stress: Not on file  Social Connections: Not on file    No current facility-administered medications on file prior to encounter.   Current Outpatient Medications on File Prior to Encounter  Medication Sig Dispense Refill   amoxicillin-clavulanate (AUGMENTIN) 875-125 MG tablet Take 1 tablet by mouth 2 (two) times daily. 10 tablet 0   guaiFENesin-codeine (ROBITUSSIN AC) 100-10 MG/5ML syrup Take 5 mLs by mouth 3 (three) times daily as needed for cough. 120 mL 0   hydrOXYzine (ATARAX) 25 MG tablet Take  1 tablet (25 mg total) by mouth 3 (three) times daily as needed. 30 tablet 2   Potassium 99 MG TABS Take 99 mg by mouth daily.     rosuvastatin (CRESTOR) 10 MG tablet Take 1 tablet (10 mg total) by mouth daily. 30 tablet 11   sertraline (ZOLOFT) 25 MG tablet Take 1 tablet (25 mg total) by mouth daily. 30 tablet 11    Review of Systems: a complete, 10pt review of systems was completed with pertinent positives and negatives as documented in the HPI  Physical Exam: Vitals:   04/26/23 1715 04/26/23 1832  BP: 129/73 (!) 154/82  Pulse: 64 64  Resp: 19 17  Temp:  98.8 F (37.1 C)  SpO2: 94% 97%   Gen: A&Ox3, no distress  Eyes: lids and conjunctivae normal, no icterus. Pupils equally round and reactive to light.  Neck:  supple without mass or thyromegaly Chest: respiratory effort is normal. No crepitus or tenderness on palpation of the chest Cardiovascular: RRR with palpable distal pulses, no pedal edema Gastrointestinal: soft, nondistended, nontender. No mass, hepatomegaly or splenomegaly.  Incarcerated left inguinal hernia which by palpation feels smaller than what is visualized on CT, and is inferior to the groin crease suggestive of femoral hernia.  This area is tender.  Not reducible. Lymphatic: no lymphadenopathy in the neck or groin Muscoloskeletal: no clubbing or cyanosis of the fingers.  Strength is symmetrical throughout.  Range of motion of bilateral upper and lower extremities normal without pain, crepitation or contracture. Neuro: cranial nerves grossly intact.  Sensation intact to light touch diffusely. Psych: appropriate mood and affect, normal insight/judgment intact  Skin: warm and dry      Latest Ref Rng & Units 04/26/2023    3:03 PM 02/15/2023    3:48 PM 02/02/2023    7:28 AM  CBC  WBC 4.0 - 10.5 K/uL 9.4  8.1  11.1   Hemoglobin 13.0 - 17.0 g/dL 81.1  91.4  78.2   Hematocrit 39.0 - 52.0 % 41.1  44.0  39.5   Platelets 150 - 400 K/uL 233  235  193        Latest Ref Rng & Units 04/26/2023    4:31 PM 04/26/2023    3:03 PM 02/15/2023    3:48 PM  CMP  Glucose 70 - 99 mg/dL  94  89   BUN 6 - 20 mg/dL  17  10   Creatinine 9.56 - 1.24 mg/dL  2.13  0.86   Sodium 578 - 145 mmol/L  138  145   Potassium 3.5 - 5.1 mmol/L  3.8  4.0   Chloride 98 - 111 mmol/L  105  108   CO2 22 - 32 mmol/L  24  21   Calcium 8.9 - 10.3 mg/dL  8.8  8.4   Total Protein 6.5 - 8.1 g/dL 6.0     Total Bilirubin 0.3 - 1.2 mg/dL 0.5     Alkaline Phos 38 - 126 U/L 36     AST 15 - 41 U/L 20     ALT 0 - 44 U/L 22       No results found for: "INR", "PROTIME"  Imaging: CT ABDOMEN PELVIS W CONTRAST  Result Date: 04/26/2023 CLINICAL DATA:  Left lower quadrant pain EXAM: CT ABDOMEN AND PELVIS WITH CONTRAST TECHNIQUE:  Multidetector CT imaging of the abdomen and pelvis was performed using the standard protocol following bolus administration of intravenous contrast. RADIATION DOSE REDUCTION: This exam was performed according to the  departmental dose-optimization program which includes automated exposure control, adjustment of the mA and/or kV according to patient size and/or use of iterative reconstruction technique. CONTRAST:  75mL OMNIPAQUE IOHEXOL 350 MG/ML SOLN COMPARISON:  CT 11/23/2021 FINDINGS: Lower chest: No acute abnormality. Hepatobiliary: Hepatic steatosis. No calcified gallstone or biliary dilatation Pancreas: Unremarkable. No pancreatic ductal dilatation or surrounding inflammatory changes. Spleen: Normal in size without focal abnormality. Adrenals/Urinary Tract: Adrenal glands are normal. Horseshoe kidney. No hydronephrosis. Bladder is normal Stomach/Bowel: Stomach is within normal limits. Appendix appears normal. No evidence of bowel wall thickening, distention, or inflammatory changes. Vascular/Lymphatic: Mild aortic atherosclerosis. No aneurysm. No suspicious lymph nodes. Reproductive: Prostate unremarkable Other: Negative for pelvic effusion or free air. Moderate fat stranding at the left inguinal region. Small fat containing right inguinal hernia. Moderate fat containing left inguinal hernia with stranding in the hernia sac. Musculoskeletal: No acute or suspicious osseous abnormality IMPRESSION: 1. Moderate fat containing left inguinal hernia with stranding in the hernia sac and at the left inguinal region, findings suspicious for incarcerated fat containing left inguinal hernia. 2. Horseshoe kidney. 3. Hepatic steatosis. 4. Aortic atherosclerosis. Aortic Atherosclerosis (ICD10-I70.0). Electronically Signed   By: Jasmine Pang M.D.   On: 04/26/2023 18:08      A/P: Fat-containing incarcerated left inguinal (more likely femoral) hernia with significant pain.  I discussed with the patient that it is safe and  reasonable for him to be discharged with outpatient follow-up to plan elective repair which would likely be within the next couple of weeks.  We discussed that admission and ultimately minimally invasive repair is feasible, but given limited OR availability currently going on due to high census of emergency cases I am not sure that this would happen in the next 24 or 48 hours and may possibly even require him to be transferred to [facility] depending on OR availability there his pain is not manageable.  He reports that his pain is not manageable and he would prefer the latter route.  .  I briefly went over the surgical technique with him and discussed risks of bleeding, infection, pain, scarring, injury to intra-abdominal or retroperitoneal structures, urinary retention, seroma/hematoma, wound healing problems, as well as hernia recurrence.  His risk of hernia recurrence is increased by active tobacco abuse although he is cutting down on this.  Also discussed general cardiovascular/pulmonary/thromboembolic complications and typical postoperative recovery, activity limitations and timeline.  Questions were welcomed and answered to his satisfaction.  Will discuss with the surgeon during the daytime timing versus location.  Okay for him to eat for now but would keep n.p.o. after midnight.  Patient Active Problem List   Diagnosis Date Noted   Inguinal hernia, incarcerated 04/26/2023   Sinusitis 04/01/2023   HLD (hyperlipidemia) 02/18/2023   Anxiety 02/15/2023   Healthcare maintenance 02/15/2023   Syncope 02/02/2023   Bradycardia 02/02/2023   Primary hypertension 02/02/2023   Major depression, recurrent (HCC) 02/19/2013   PTSD (post-traumatic stress disorder) 02/19/2013   Alcohol abuse 02/19/2013   TOBACCO DEPENDENCE 12/08/2006   DJD, UNSPECIFIED 12/08/2006       Phylliss Blakes, MD [facility]  See AMION to contact appropriate on-call provider   Mdm- high

## 2023-04-26 NOTE — ED Triage Notes (Addendum)
Pt c/o L inguinal hernia causing pain; tried to reduce at PCP, unsuccessful; sent to ed for further evaluation; hx L hernia repair 23 years ago

## 2023-04-26 NOTE — ED Provider Notes (Signed)
[place] EMERGENCY DEPARTMENT AT [facility] Provider Note   CSN: [id number] Arrival date & time: 04/26/23  1411     History Hernia Chief Complaint  Patient presents with   Hernia    Aaron [name] is a 57 y.o. male.  57 year old with a prior history of hernia presents to the ED with complaints of left lower quadrant pain that is been ongoing for the past 2 days.  Prior history of incarcerated hernia, had to have this surgically intervene.  Went to internal medicine clinic today, they attempted to reduce this x 2 and were unsuccessful.  He endorses severe sharp pain to the left lower quadrant, exacerbated with any type of increase in pressure.  He reports having a bowel movement yesterday, reports this exacerbated the pain.  Describes the size of his hernia as a "large golf ball ".  Has tried taking over-the-counter medication without any improvement in his symptoms. No urinary symptoms, no fever, nausea or vomiting.   The history is provided by the patient.       Home Medications Prior to Admission medications   Medication Sig Start Date End Date Taking? Authorizing Provider  amoxicillin-clavulanate (AUGMENTIN) 875-125 MG tablet Take 1 tablet by mouth 2 (two) times daily. 03/31/23   Masters, Katie, DO  guaiFENesin-codeine (ROBITUSSIN AC) 100-10 MG/5ML syrup Take 5 mLs by mouth 3 (three) times daily as needed for cough. 03/31/23   Masters, Florentina Addison, DO  hydrOXYzine (ATARAX) 25 MG tablet Take 1 tablet (25 mg total) by mouth 3 (three) times daily as needed. 02/15/23   Doran Stabler, DO  Potassium 99 MG TABS Take 99 mg by mouth daily.    [provider]  rosuvastatin (CRESTOR) 10 MG tablet Take 1 tablet (10 mg total) by mouth daily. 02/18/23 02/18/24  Doran Stabler, DO  sertraline (ZOLOFT) 25 MG tablet Take 1 tablet (25 mg total) by mouth daily. 02/15/23 02/15/24  Doran Stabler, DO      Allergies    Patient has no known allergies.    Review of Systems   Review of Systems   Constitutional:  Negative for chills and fever.  Respiratory:  Negative for shortness of breath.   Cardiovascular:  Negative for chest pain.  Gastrointestinal:  Positive for abdominal pain. Negative for constipation, diarrhea, nausea and vomiting.  Genitourinary:  Negative for decreased urine volume, flank pain, penile discharge, penile pain and scrotal swelling.  Musculoskeletal:  Negative for back pain.  All other systems reviewed and are negative.   Physical Exam Updated Vital Signs BP (!) 154/82   Pulse 64   Temp 98.8 F (37.1 C) (Oral)   Resp 17   SpO2 97%  Physical Exam Vitals and nursing note reviewed.  Constitutional:      Appearance: Normal appearance.  HENT:     Head: Normocephalic and atraumatic.     Mouth/Throat:     Mouth: Mucous membranes are moist.  Cardiovascular:     Rate and Rhythm: Normal rate.  Pulmonary:     Effort: Pulmonary effort is normal.  Abdominal:     Tenderness: There is abdominal tenderness in the left lower quadrant. There is guarding. There is no left CVA tenderness.     Hernia: A hernia is present. Hernia is present in the left inguinal area.    Musculoskeletal:     Cervical back: Normal range of motion and neck supple.  Skin:    General: Skin is warm and dry.  Neurological:     Mental  Status: He is alert and oriented to person, place, and time.     ED Results / Procedures / Treatments   Labs (all labs ordered are listed, but only abnormal results are displayed) Labs Reviewed  BASIC METABOLIC PANEL - Abnormal; Notable for the following components:      Result Value   Calcium 8.8 (*)    All other components within normal limits  HEPATIC FUNCTION PANEL - Abnormal; Notable for the following components:   Total Protein 6.0 (*)    Albumin 3.1 (*)    Alkaline Phosphatase 36 (*)    All other components within normal limits  CBC WITH DIFFERENTIAL/PLATELET    EKG None  Radiology CT ABDOMEN PELVIS W CONTRAST  Result Date:  04/26/2023 CLINICAL DATA:  Left lower quadrant pain EXAM: CT ABDOMEN AND PELVIS WITH CONTRAST TECHNIQUE: Multidetector CT imaging of the abdomen and pelvis was performed using the standard protocol following bolus administration of intravenous contrast. RADIATION DOSE REDUCTION: This exam was performed according to the departmental dose-optimization program which includes automated exposure control, adjustment of the mA and/or kV according to patient size and/or use of iterative reconstruction technique. CONTRAST:  75mL OMNIPAQUE IOHEXOL 350 MG/ML SOLN COMPARISON:  CT 11/23/2021 FINDINGS: Lower chest: No acute abnormality. Hepatobiliary: Hepatic steatosis. No calcified gallstone or biliary dilatation Pancreas: Unremarkable. No pancreatic ductal dilatation or surrounding inflammatory changes. Spleen: Normal in size without focal abnormality. Adrenals/Urinary Tract: Adrenal glands are normal. Horseshoe kidney. No hydronephrosis. Bladder is normal Stomach/Bowel: Stomach is within normal limits. Appendix appears normal. No evidence of bowel wall thickening, distention, or inflammatory changes. Vascular/Lymphatic: Mild aortic atherosclerosis. No aneurysm. No suspicious lymph nodes. Reproductive: Prostate unremarkable Other: Negative for pelvic effusion or free air. Moderate fat stranding at the left inguinal region. Small fat containing right inguinal hernia. Moderate fat containing left inguinal hernia with stranding in the hernia sac. Musculoskeletal: No acute or suspicious osseous abnormality IMPRESSION: 1. Moderate fat containing left inguinal hernia with stranding in the hernia sac and at the left inguinal region, findings suspicious for incarcerated fat containing left inguinal hernia. 2. Horseshoe kidney. 3. Hepatic steatosis. 4. Aortic atherosclerosis. Aortic Atherosclerosis (ICD10-I70.0). Electronically Signed   By: Jasmine Pang M.D.   On: 04/26/2023 18:08    Procedures Procedures    Medications Ordered  in ED Medications  ondansetron (ZOFRAN) injection 4 mg (4 mg Intravenous Given 04/26/23 1616)  morphine (PF) 4 MG/ML injection 4 mg (4 mg Intravenous Given 04/26/23 1617)  iohexol (OMNIPAQUE) 350 MG/ML injection 75 mL (75 mLs Intravenous Contrast Given 04/26/23 1729)  morphine (PF) 4 MG/ML injection 4 mg (4 mg Intravenous Given 04/26/23 1833)    ED Course/ Medical Decision Making/ A&P                             Medical Decision Making Amount and/or Complexity of Data Reviewed Labs: ordered. Radiology: ordered.  Risk Prescription drug management.     This patient presents to the ED for concern of left lower quadrant pain, this involves a number of treatment options, and is a complaint that carries with it a high risk of complications and morbidity.  The differential diagnosis includes incarcerated versus strangulated hernia.    Co morbidities: Discussed in HPI   Brief History:  See HPI.   EMR reviewed including pt PMHx, past surgical history and past visits to ER.   See HPI for more details  Lab Tests:  I ordered and independently  interpreted labs.  The pertinent results include:    I personally reviewed all laboratory work and imaging. Metabolic panel without any acute abnormality specifically kidney function within normal limits and no significant electrolyte abnormalities. CBC without leukocytosis or significant anemia.   Imaging Studies:  CT Abdomen and pelvis: IMPRESSION:  1. Moderate fat containing left inguinal hernia with stranding in  the hernia sac and at the left inguinal region, findings suspicious  for incarcerated fat containing left inguinal hernia.  2. Horseshoe kidney.  3. Hepatic steatosis.  4. Aortic atherosclerosis.   Medicines ordered:  I ordered medication including zofran, morphine, morphine  for pain control Reevaluation of the patient after these medicines showed that the patient stayed the same I have reviewed the patients home  medicines and have made adjustments as needed  Consults:  06:20 PM I requested consultation with general surgery Dr. Fredricka Bonine,  and discussed lab and imaging findings as well as pertinent plan - they recommend: pain control and outpatient follow up. However, she has evaluated patient and as pain is not controlled will bring patient in for pain control.   Reevaluation:  After the interventions noted above I re-evaluated patient and found that they have :stayed the same  Social Determinants of Health:  The patient's social determinants of health were a factor in the care of this patient   Problem List / ED Course:  Patient presents to the ED with a chief complaint of left lower quadrant pain which has been ongoing for the past several days, prior history of hernia.  Here with a left inguinal hernia that he noted to be likely incarcerated.  Evaluated by internal medicine service and sent here to rule out strangulation.  Labs here reveal a CBC with no leukocytosis, hemoglobin is within normal limits.  BMP with no electrolyte derangement, creatinine levels unremarkable.  LFTs are within normal limits.  He denies any vomiting, however has had some nausea.  He received multiple rounds of morphine to help with pain control along with Zofran. CT Abdomen pelvis showed incarcerated hernia, surgery was consulted, Dr. Fredricka Bonine evaluated patient and agrees with admission for pain control at this time as we were unable to reduce at this point. He is hemodynamically stable. He continues to complain of pain despite multiple rounds of medication.  I discussed this case with internal medicine team who has agreed on admission at this time.  Patient remains hemodynamically stable for admission.  Dispostion:  After consideration of the diagnostic results and the patients response to treatment, I feel that the patent would benefit from admission.     Portions of this note were generated with Herbalist. Dictation errors may occur despite best attempts at proofreading.   Final Clinical Impression(s) / ED Diagnoses Final diagnoses:  Unilateral incarcerated inguinal hernia    Rx / DC Orders ED Discharge Orders     None         Claude Manges, Cordelia Poche 04/26/23 1907    Rondel Baton, MD 04/27/23 1354

## 2023-04-26 NOTE — Hospital Course (Addendum)
Incarcerated hernia LLQ 2 days VS stable No leukocytosis Incarcerated hernia - pain is uncontrolled Nauseated and in pain is not controlled despite multiple rounds of morphine Trendelberg for 2 week  General Surgery - will have  NPO tonight in hopes to add him   Sunday morning noted some irritation around the area of the hernia, was more of a discomfort than pain when moving Then yesterday was a lot more swollen and painful. Is having BM, passing gas Last night his BM had abdominal pain with valsalva to have BM No n/v Pain now is a 3-4/10 after pain meds, before it was an 8-9/10 Pain worse with attempts to reduce it Pain is around hernia.bending over/reaching over does cause pinching, sharp pain up into side. Like getting kicked in the groin.  PMH: Anxiety HTN Vasovagal syncope  PSH Hernia repair 23 years ago  Meds Hydroxyzine 25 mg TID PRN anxiety, takes about 1 time per day Potassium 99 mg tablet, 1 tab daily Sertraline 25 mg daily  Fam Hx F: Lung cancer S: Parkinson's disease  Soc hx: PCP is through IMT[facility]. Lives at home with one of his daughters. 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 He smokes 5-6 cigarettes daily, used to smoke 1-1.5 PPD and has smoked for last ~40 years. Denies alcohol use except every so often socially (every few months), has occasional marijuana use but no other illicit substance use. Is independent in ADLs and IADLs.

## 2023-04-26 NOTE — ED Provider Triage Note (Signed)
Emergency Medicine Provider Triage Evaluation Note  Aaron [name] , a 57 y.o. male  was evaluated in triage.  Pt complains of inguinal hernia on the left.  He had onset of an inguinal hernia x 3 days.  He states that over the weekend he took it easier and it is about half the size it was when it initially occurred but he still having firm bulging tenderness in the left groin.  He went to the internal medicine clinic today where 2 providers tried to reduce it and could not so sent him here for evaluation of strangulated hernia..  Review of Systems  Positive: Hernia of the left inguinal region Negative: Fever  Physical Exam  BP 116/76   Pulse 81   Temp 98.8 F (37.1 C) (Oral)   Resp 18   SpO2 96%  Gen:   Awake, no distress   Resp:  Normal effort  MSK:   Moves extremities without difficulty  Other:  Bulging, erythematous and tender hernia in the left inguinal region.  It is firm to the touch.  Medical Decision Making  Medically screening exam initiated at 2:59 PM.  Appropriate orders placed.  Aaron [name] was informed that the remainder of the evaluation will be completed by another provider, this initial triage assessment does not replace that evaluation, and the importance of remaining in the ED until their evaluation is complete.  Patient with bulging left inguinal hernia concerning for strangulation.  I have ordered labs.  Charge nurse informed that he needs the next bed.   Arthor Captain, PA-C 04/26/23 1504

## 2023-04-26 NOTE — Telephone Encounter (Signed)
Patient walk in states think he has a hernia just below the belt line in the left side.  Pain level of 8. Goes up his left side when he moves.  Dull pain.  Has had previous Hernia surgery 23 years ago.  When he tried to go to the bathroom for a BM was extremely painful.

## 2023-04-26 NOTE — Patient Instructions (Addendum)
Thank you, Aaron [name] Aaron [name] for allowing [place] to provide your care today. Today we discussed the swelling in the lower abdomen. - Head to the Emergency room for a follow up, potentially with a surgeon    - Please follow up with after you are treated   I have ordered the following labs for you:  Lab Orders  No laboratory test(s) ordered today     Tests ordered today:    Referrals ordered today:   Referral Orders  No referral(s) requested today     I have ordered the following medication/changed the following medications:   Stop the following medications: There are no discontinued medications.   Start the following medications: No orders of the defined types were placed in this encounter.    Follow up:  After potential surgery     Remember:   Should you have any questions or concerns please call the internal medicine clinic at [phone].    Aaron [name], M.D [facility]

## 2023-04-27 ENCOUNTER — Encounter (HOSPITAL_COMMUNITY): Payer: Self-pay | Admitting: Internal Medicine

## 2023-04-27 DIAGNOSIS — R1032 Left lower quadrant pain: Secondary | ICD-10-CM | POA: Diagnosis present

## 2023-04-27 DIAGNOSIS — K4030 Unilateral inguinal hernia, with obstruction, without gangrene, not specified as recurrent: Secondary | ICD-10-CM | POA: Diagnosis not present

## 2023-04-27 DIAGNOSIS — F419 Anxiety disorder, unspecified: Secondary | ICD-10-CM | POA: Diagnosis present

## 2023-04-27 DIAGNOSIS — L03314 Cellulitis of groin: Secondary | ICD-10-CM | POA: Diagnosis present

## 2023-04-27 DIAGNOSIS — Z79899 Other long term (current) drug therapy: Secondary | ICD-10-CM | POA: Diagnosis not present

## 2023-04-27 DIAGNOSIS — E785 Hyperlipidemia, unspecified: Secondary | ICD-10-CM | POA: Diagnosis not present

## 2023-04-27 DIAGNOSIS — R233 Spontaneous ecchymoses: Secondary | ICD-10-CM | POA: Diagnosis not present

## 2023-04-27 DIAGNOSIS — F411 Generalized anxiety disorder: Secondary | ICD-10-CM | POA: Diagnosis not present

## 2023-04-27 DIAGNOSIS — I10 Essential (primary) hypertension: Secondary | ICD-10-CM | POA: Diagnosis present

## 2023-04-27 DIAGNOSIS — K760 Fatty (change of) liver, not elsewhere classified: Secondary | ICD-10-CM | POA: Diagnosis present

## 2023-04-27 DIAGNOSIS — F17210 Nicotine dependence, cigarettes, uncomplicated: Secondary | ICD-10-CM | POA: Diagnosis present

## 2023-04-27 DIAGNOSIS — Q631 Lobulated, fused and horseshoe kidney: Secondary | ICD-10-CM | POA: Diagnosis not present

## 2023-04-27 LAB — BASIC METABOLIC PANEL
Anion gap: 7 (ref 5–15)
BUN: 17 mg/dL (ref 6–20)
CO2: 28 mmol/L (ref 22–32)
Calcium: 8.8 mg/dL — ABNORMAL LOW (ref 8.9–10.3)
Chloride: 105 mmol/L (ref 98–111)
Creatinine, Ser: 1.11 mg/dL (ref 0.61–1.24)
GFR, Estimated: >60 mL/min (ref >60)
Glucose, Bld: 97 mg/dL (ref 70–99)
Potassium: 4.3 mmol/L (ref 3.5–5.1)
Sodium: 140 mmol/L (ref 135–145)

## 2023-04-27 LAB — CBC
HCT: 37.2 % — ABNORMAL LOW (ref 39.0–52.0)
Hemoglobin: 12.6 g/dL — ABNORMAL LOW (ref 13.0–17.0)
MCH: 32.5 pg (ref 26.0–34.0)
MCHC: 33.9 g/dL (ref 30.0–36.0)
MCV: 95.9 fL (ref 80.0–100.0)
Platelets: 217 10*3/uL (ref 150–400)
RBC: 3.88 MIL/uL — ABNORMAL LOW (ref 4.22–5.81)
RDW: 12.8 % (ref 11.5–15.5)
WBC: 6.7 10*3/uL (ref 4.0–10.5)
nRBC: 0 % (ref 0.0–0.2)

## 2023-04-27 MED ORDER — SERTRALINE HCL 50 MG PO TABS
25.0000 mg | ORAL_TABLET | Freq: Every day | ORAL | Status: DC
  Administered 2023-04-27 – 2023-04-30 (×4): 25 mg via ORAL
  Filled 2023-04-27 (×5): qty 1

## 2023-04-27 MED ORDER — SERTRALINE HCL 50 MG PO TABS: 25.0000 mg | ORAL_TABLET | Freq: Every day | ORAL | Status: DC

## 2023-04-27 MED ORDER — CEFAZOLIN SODIUM-DEXTROSE 1-4 GM/50ML-% IV SOLN
1.0000 g | Freq: Three times a day (TID) | INTRAVENOUS | Status: DC
  Administered 2023-04-27 – 2023-04-30 (×7): 1 g via INTRAVENOUS
  Filled 2023-04-27 (×13): qty 50

## 2023-04-27 MED ORDER — HYDROXYZINE HCL 25 MG PO TABS: 25.0000 mg | ORAL_TABLET | Freq: Three times a day (TID) | ORAL | Status: DC | PRN

## 2023-04-27 NOTE — Progress Notes (Addendum)
HD#0 Subjective:  Overnight Events: No acute events overnight.    This morning, he is feeling well. Endorses improvement in pain with the current pain regimen. Had 1 bowel movement.  Objective:  Vital signs in last 24 hours: Vitals:   04/26/23 2145 04/26/23 2222 04/27/23 0433 04/27/23 0849  BP: 113/62 116/62 136/79 132/74  Pulse: 63 64 60 61  Resp: 20 20 18 18   Temp: 97.6 F (36.4 C) 97.6 F (36.4 C) 98.3 F (36.8 C) 98.4 F (36.9 C)  TempSrc: Oral Oral Oral Oral  SpO2: 95% 96% 98% 96%   Supplemental O2: Room Air SpO2: 96 %   Physical Exam:   General: Pleasant, not in acute distress. CV: RRR. No m/r/g. No LE edema Pulmonary: Lungs CTAB. Normal effort. No wheezing or rales. Abdominal: Soft, nondistended. Left inguinal hernia that is non reducible, erythematous and tender to palpation.   Extremities: Palpable dorsalis pedis and radialis pulses. Normal ROM. Skin: Warm and dry. No obvious rash or lesions. Psych: Normal mood and affect  There were no vitals filed for this visit.   Intake/Output Summary (Last 24 hours) at 04/27/2023 1109 Last data filed at 04/26/2023 2350 Gross per 24 hour  Intake 240 ml  Output --  Net 240 ml   Net IO Since Admission: 240 mL [04/27/23 1109]  No results for input(s): "GLUCAP" in the last 72 hours.   Pertinent Labs:    Latest Ref Rng & Units 04/26/2023   11:49 PM 04/26/2023    3:03 PM 02/15/2023    3:48 PM  CBC  WBC 4.0 - 10.5 K/uL 6.7  9.4  8.1   Hemoglobin 13.0 - 17.0 g/dL 16.1  09.6  04.5   Hematocrit 39.0 - 52.0 % 37.2  41.1  44.0   Platelets 150 - 400 K/uL 217  233  235        Latest Ref Rng & Units 04/26/2023   11:49 PM 04/26/2023    4:31 PM 04/26/2023    3:03 PM  CMP  Glucose 70 - 99 mg/dL 97   94   BUN 6 - 20 mg/dL 17   17   Creatinine 4.09 - 1.24 mg/dL 8.11   9.14   Sodium 782 - 145 mmol/L 140   138   Potassium 3.5 - 5.1 mmol/L 4.3   3.8   Chloride 98 - 111 mmol/L 105   105   CO2 22 - 32 mmol/L 28   24    Calcium 8.9 - 10.3 mg/dL 8.8   8.8   Total Protein 6.5 - 8.1 g/dL  6.0    Total Bilirubin 0.3 - 1.2 mg/dL  0.5    Alkaline Phos 38 - 126 U/L  36    AST 15 - 41 U/L  20    ALT 0 - 44 U/L  22      Imaging: CT ABDOMEN PELVIS W CONTRAST  Result Date: 04/26/2023 CLINICAL DATA:  Left lower quadrant pain EXAM: CT ABDOMEN AND PELVIS WITH CONTRAST TECHNIQUE: Multidetector CT imaging of the abdomen and pelvis was performed using the standard protocol following bolus administration of intravenous contrast. RADIATION DOSE REDUCTION: This exam was performed according to the departmental dose-optimization program which includes automated exposure control, adjustment of the mA and/or kV according to patient size and/or use of iterative reconstruction technique. CONTRAST:  75mL OMNIPAQUE IOHEXOL 350 MG/ML SOLN COMPARISON:  CT 11/23/2021 FINDINGS: Lower chest: No acute abnormality. Hepatobiliary: Hepatic steatosis. No calcified gallstone or biliary dilatation Pancreas:  Unremarkable. No pancreatic ductal dilatation or surrounding inflammatory changes. Spleen: Normal in size without focal abnormality. Adrenals/Urinary Tract: Adrenal glands are normal. Horseshoe kidney. No hydronephrosis. Bladder is normal Stomach/Bowel: Stomach is within normal limits. Appendix appears normal. No evidence of bowel wall thickening, distention, or inflammatory changes. Vascular/Lymphatic: Mild aortic atherosclerosis. No aneurysm. No suspicious lymph nodes. Reproductive: Prostate unremarkable Other: Negative for pelvic effusion or free air. Moderate fat stranding at the left inguinal region. Small fat containing right inguinal hernia. Moderate fat containing left inguinal hernia with stranding in the hernia sac. Musculoskeletal: No acute or suspicious osseous abnormality IMPRESSION: 1. Moderate fat containing left inguinal hernia with stranding in the hernia sac and at the left inguinal region, findings suspicious for incarcerated fat  containing left inguinal hernia. 2. Horseshoe kidney. 3. Hepatic steatosis. 4. Aortic atherosclerosis. Aortic Atherosclerosis (ICD10-I70.0). Electronically Signed   By: Jasmine Pang M.D.   On: 04/26/2023 18:08    Assessment/Plan:   Principal Problem:   Inguinal hernia with incarceration   Patient Summary: Aaron [name] is a 57 y.o.  who presented with Incarcerated inguinal hernia complicated by increasing pain.  Vitals and labs stable. Surgery is following with a tentative date for OR scheduled on 04/28/2023.  #Incarcerated Hernia (Left)  Given location, it is likely femoral hernia. Pain is controlled with current pain regimen. General surgery is following, surgery is tentatively scheduled for 04/28/2023.   -- NPO at midnight. -- Continue with Dilaudid, and Tylenol for pain.   #GAD  -Sertraline 25 mg daily  #Hyperlipidemia  10 years ASCVD risk 10-12% Was prescribed rosuvastatin 10mg  daily but does not currently take it.  --Consider starting rosuvastatin.    #History of Hypokalemia        #History of Leg cramping  Potassium stable at 4.3. Holding home dose of potassium.   Diet: Regular diet IVF: PO intake VTE: SCDs Start: 04/26/23 2048 Code: Full PT/OT: None ID:  Anti-infectives (From admission, onward)    None        Anticipated discharge to Home in 2 days pending surgery.  Laretta Bolster, MD 04/27/2023, 11:09 AM Pager: [phone] [facility]  Please contact the on call pager after 5 pm and on weekends at [phone].

## 2023-04-27 NOTE — Plan of Care (Signed)
 °  Problem: Education: °Goal: Knowledge of General Education information will improve °Description: Including pain rating scale, medication(s)/side effects and non-pharmacologic comfort measures °Outcome: Progressing °  °Problem: Health Behavior/Discharge Planning: °Goal: Ability to manage health-related needs will improve °Outcome: Progressing °  °Problem: Clinical Measurements: °Goal: Cardiovascular complication will be avoided °Outcome: Progressing °  °

## 2023-04-27 NOTE — Plan of Care (Signed)
  Problem: Activity: Goal: Risk for activity intolerance will decrease Outcome: Progressing   Problem: Pain Managment: Goal: General experience of comfort will improve Outcome: Progressing   Problem: Safety: Goal: Ability to remain free from injury will improve Outcome: Progressing   Problem: Nutrition: Goal: Adequate nutrition will be maintained Outcome: Progressing   

## 2023-04-27 NOTE — TOC Initial Note (Signed)
Transition of Care (TOC) - Initial/Assessment Note   Patient from home with 57 year old daughter   Confirmed face sheet information.   Patient did not have any DME prior to admission.   TOC will continue to follow for discharge needs  Patient Details  Name: Aaron [name] MRN: [id number] Date of Birth: 1965-11-19  Transition of Care [facility]) CM/SW Contact:    Kingsley Plan, RN Phone Number: 04/27/2023, 11:40 AM  Clinical Narrative:                   Expected Discharge Plan: Home/Self Care Barriers to Discharge: Continued Medical Work up   Patient Goals and CMS Choice Patient states their goals for this hospitalization and ongoing recovery are:: to return to home      ownership interest in [facility].provided to:: Patient    Expected Discharge Plan and Services   Discharge Planning Services: CM Consult   Living arrangements for the past 2 months: Single Family Home                 DME Arranged: N/A         HH Arranged: NA          Prior Living Arrangements/Services Living arrangements for the past 2 months: Single Family Home Lives with:: Adult Children Patient language and need for interpreter reviewed:: Yes Do you feel safe going back to the place where you live?: Yes      Need for Family Participation in Patient Care: No (Comment) Care giver support system in place?: Yes (comment)      Activities of Daily Living Home Assistive Devices/Equipment: None ADL Screening (condition at time of admission) Patient's cognitive ability adequate to safely complete daily activities?: Yes Is the patient deaf or have difficulty hearing?: No Does the patient have difficulty seeing, even when wearing glasses/contacts?: No Does the patient have difficulty concentrating, remembering, or making decisions?: No Patient able to express need for assistance with ADLs?: No Does the patient have difficulty dressing or bathing?: No Independently performs  ADLs?: Yes (appropriate for developmental age) Does the patient have difficulty walking or climbing stairs?: No Weakness of Legs: None Weakness of Arms/Hands: None  Permission Sought/Granted   Permission granted to share information with : No              Emotional Assessment Appearance:: Appears stated age Attitude/Demeanor/Rapport: Engaged Affect (typically observed): Accepting Orientation: : Oriented to Self, Oriented to Place, Oriented to  Time, Oriented to Situation Alcohol / Substance Use: Not Applicable Psych Involvement: No (comment)  Admission diagnosis:  Inguinal hernia with incarceration [K40.30] Unilateral incarcerated inguinal hernia [K40.30] Patient Active Problem List   Diagnosis Date Noted   Inguinal hernia, incarcerated 04/26/2023   Inguinal hernia with incarceration 04/26/2023   Sinusitis 04/01/2023   HLD (hyperlipidemia) 02/18/2023   Anxiety 02/15/2023   Healthcare maintenance 02/15/2023   Syncope 02/02/2023   Bradycardia 02/02/2023   Primary hypertension 02/02/2023   Major depression, recurrent (HCC) 02/19/2013   PTSD (post-traumatic stress disorder) 02/19/2013   Alcohol abuse 02/19/2013   TOBACCO DEPENDENCE 12/08/2006   DJD, UNSPECIFIED 12/08/2006   PCP:  Rudene Christians, DO Pharmacy:   [id number] - [place] - [address] [place] ([place]) [place] [address] Phone: [phone] Fax: [phone]     Social Determinants of Health (SDOH) Social History: SDOH Screenings   Food Insecurity: No Food Insecurity (04/26/2023)  Housing: Low Risk  (02/02/2023)  Transportation Needs: No Transportation Needs (02/02/2023)  Utilities: Not At Risk (02/02/2023)  Depression (PHQ2-9): Low Risk  (04/26/2023)  Tobacco Use: High Risk (04/26/2023)   SDOH Interventions:     Readmission Risk Interventions     No data to display

## 2023-04-27 NOTE — Progress Notes (Signed)
[facility] Progress Note     Subjective: CC:  Ongoing tenderness over LIH. Stays it has been present for 3 days. Denies abdominal pain nausea or vomiting. +Flatus.  Objective: Vital signs in last 24 hours: Temp:  [97.6 F (36.4 C)-98.8 F (37.1 C)] 98.4 F (36.9 C) (07/17 0849) Pulse Rate:  [60-81] 61 (07/17 0849) Resp:  [17-27] 18 (07/17 0849) BP: (113-154)/(62-82) 132/74 (07/17 0849) SpO2:  [93 %-98 %] 96 % (07/17 0849) Last BM Date : 04/26/23  Intake/Output from previous day: 07/16 0701 - 07/17 0700 In: 240 [P.O.:240] Out: -  Intake/Output this shift: No intake/output data recorded.  PE: Gen:  Alert, NAD, pleasant Card:  Regular rate and rhythm Pulm:  Normal effort ORA Abd: Soft, non-tender, non-distended, LIH with mild overlying erythema, mild tenderness, I am unable to reduce it.  Skin: warm and dry, no rashes  Psych: A&Ox3   Lab Results:  Recent Labs    04/26/23 1503 04/26/23 2349  WBC 9.4 6.7  HGB 13.6 12.6*  HCT 41.1 37.2*  PLT 233 217   BMET Recent Labs    04/26/23 1503 04/26/23 2349  NA 138 140  K 3.8 4.3  CL 105 105  CO2 24 28  GLUCOSE 94 97  BUN 17 17  CREATININE 1.14 1.11  CALCIUM 8.8* 8.8*   PT/INR No results for input(s): "LABPROT", "INR" in the last 72 hours. CMP     Component Value Date/Time   NA 140 04/26/2023 2349   NA 145 (H) 02/15/2023 1548   K 4.3 04/26/2023 2349   CL 105 04/26/2023 2349   CO2 28 04/26/2023 2349   GLUCOSE 97 04/26/2023 2349   BUN 17 04/26/2023 2349   BUN 10 02/15/2023 1548   CREATININE 1.11 04/26/2023 2349   CALCIUM 8.8 (L) 04/26/2023 2349   PROT 6.0 (L) 04/26/2023 1631   ALBUMIN 3.1 (L) 04/26/2023 1631   AST 20 04/26/2023 1631   ALT 22 04/26/2023 1631   ALKPHOS 36 (L) 04/26/2023 1631   BILITOT 0.5 04/26/2023 1631   GFRNONAA >60 04/26/2023 2349   GFRAA >90 02/19/2013 0635   Lipase     Component Value Date/Time   LIPASE 36 11/23/2021 1650       Studies/Results: CT ABDOMEN  PELVIS W CONTRAST  Result Date: 04/26/2023 CLINICAL DATA:  Left lower quadrant pain EXAM: CT ABDOMEN AND PELVIS WITH CONTRAST TECHNIQUE: Multidetector CT imaging of the abdomen and pelvis was performed using the standard protocol following bolus administration of intravenous contrast. RADIATION DOSE REDUCTION: This exam was performed according to the departmental dose-optimization program which includes automated exposure control, adjustment of the mA and/or kV according to patient size and/or use of iterative reconstruction technique. CONTRAST:  75mL OMNIPAQUE IOHEXOL 350 MG/ML SOLN COMPARISON:  CT 11/23/2021 FINDINGS: Lower chest: No acute abnormality. Hepatobiliary: Hepatic steatosis. No calcified gallstone or biliary dilatation Pancreas: Unremarkable. No pancreatic ductal dilatation or surrounding inflammatory changes. Spleen: Normal in size without focal abnormality. Adrenals/Urinary Tract: Adrenal glands are normal. Horseshoe kidney. No hydronephrosis. Bladder is normal Stomach/Bowel: Stomach is within normal limits. Appendix appears normal. No evidence of bowel wall thickening, distention, or inflammatory changes. Vascular/Lymphatic: Mild aortic atherosclerosis. No aneurysm. No suspicious lymph nodes. Reproductive: Prostate unremarkable Other: Negative for pelvic effusion or free air. Moderate fat stranding at the left inguinal region. Small fat containing right inguinal hernia. Moderate fat containing left inguinal hernia with stranding in the hernia sac. Musculoskeletal: No acute or suspicious osseous abnormality IMPRESSION: 1. Moderate fat containing  left inguinal hernia with stranding in the hernia sac and at the left inguinal region, findings suspicious for incarcerated fat containing left inguinal hernia. 2. Horseshoe kidney. 3. Hepatic steatosis. 4. Aortic atherosclerosis. Aortic Atherosclerosis (ICD10-I70.0). Electronically Signed   By: Jasmine Pang M.D.   On: 04/26/2023 18:08     Anti-infectives: Anti-infectives (From admission, onward)    None        Assessment/Plan  Fat-containing incarcerated left inguinal (more likely femoral) hernia with significant pain.   - no emergent rule for surgical fixation today. Unfortunately no room on the OR schedule today (here at [facility] or at [place]) - tentative plan for open hernia repair with mesh tomorrow 7/18 - Reg diet, NPO MN  - continue PRN analgesics    LOS: 0 days   I reviewed nursing notes, hospitalist notes, last 24 h vitals and pain scores, last 48 h intake and output, last 24 h labs and trends, and last 24 h imaging results.  This care required straight-forward level of medical decision making.   Hosie Spangle, PA-C [facility] Please see Amion for pager number during day hours 7:00am-4:30pm

## 2023-04-27 NOTE — Progress Notes (Signed)
Internal Medicine Clinic Attending  I was physically present during the key portions of the resident provided service and participated in the medical decision making of patient's management care. I reviewed pertinent patient test results.  The assessment, diagnosis, and plan were formulated together and I agree with the documentation in the resident's note.  Patient with an incarcerated left inguinal or maybe femoral hernia. I think it is fat containing based on my exam, the hernia is firm and rubbery. I tried my best for several minutes to reduce the hernia but was unable to. The patient did pretty well with the pain that it caused, but this is clearly limiting him in walking and functioning at home. He has no systemic symptoms to suggest infarction at this time, but is at risk in the coming days. I was left with no choice but to refer him to the ED for a same-day surgery consultation. He understands that there are no medical therapies for this and will need repair sometime soon.  Erlinda Hong, MD FACP

## 2023-04-27 NOTE — Plan of Care (Signed)
  Problem: Health Behavior/Discharge Planning: Goal: Ability to manage health-related needs will improve Outcome: Progressing   Problem: Clinical Measurements: Goal: Will remain free from infection Outcome: Progressing   Problem: Coping: Goal: Level of anxiety will decrease Outcome: Progressing   

## 2023-04-28 LAB — CBC
HCT: 36.9 % — ABNORMAL LOW (ref 39.0–52.0)
Hemoglobin: 12.7 g/dL — ABNORMAL LOW (ref 13.0–17.0)
MCH: 32.1 pg (ref 26.0–34.0)
MCHC: 34.4 g/dL (ref 30.0–36.0)
MCV: 93.2 fL (ref 80.0–100.0)
Platelets: 218 10*3/uL (ref 150–400)
RBC: 3.96 MIL/uL — ABNORMAL LOW (ref 4.22–5.81)
RDW: 12.3 % (ref 11.5–15.5)
WBC: 6.8 10*3/uL (ref 4.0–10.5)
nRBC: 0 % (ref 0.0–0.2)

## 2023-04-28 NOTE — Progress Notes (Signed)
[facility] Progress Note     Subjective: CC:  No new complaints. Still having LIH pain but it improves with meds.  Objective: Vital signs in last 24 hours: Temp:  [98.2 F (36.8 C)-98.5 F (36.9 C)] 98.3 F (36.8 C) (07/18 0845) Pulse Rate:  [66-71] 67 (07/18 0845) Resp:  [17-18] 18 (07/18 0845) BP: (131-139)/(71-93) 131/77 (07/18 0845) SpO2:  [95 %-100 %] 96 % (07/18 0845) Last BM Date : 04/26/23  Intake/Output from previous day: No intake/output data recorded. Intake/Output this shift: No intake/output data recorded.  PE: Gen:  Alert, NAD, pleasant Card:  Regular rate and rhythm Pulm:  Normal effort ORA Abd: Soft, non-tender, non-distended, LIH with mild overlying erythema, mild tenderness, I am unable to reduce it.  Skin: warm and dry, no rashes  Psych: A&Ox3   Lab Results:  Recent Labs    04/26/23 2349 04/28/23 0042  WBC 6.7 6.8  HGB 12.6* 12.7*  HCT 37.2* 36.9*  PLT 217 218   BMET Recent Labs    04/26/23 1503 04/26/23 2349  NA 138 140  K 3.8 4.3  CL 105 105  CO2 24 28  GLUCOSE 94 97  BUN 17 17  CREATININE 1.14 1.11  CALCIUM 8.8* 8.8*   PT/INR No results for input(s): "LABPROT", "INR" in the last 72 hours. CMP     Component Value Date/Time   NA 140 04/26/2023 2349   NA 145 (H) 02/15/2023 1548   K 4.3 04/26/2023 2349   CL 105 04/26/2023 2349   CO2 28 04/26/2023 2349   GLUCOSE 97 04/26/2023 2349   BUN 17 04/26/2023 2349   BUN 10 02/15/2023 1548   CREATININE 1.11 04/26/2023 2349   CALCIUM 8.8 (L) 04/26/2023 2349   PROT 6.0 (L) 04/26/2023 1631   ALBUMIN 3.1 (L) 04/26/2023 1631   AST 20 04/26/2023 1631   ALT 22 04/26/2023 1631   ALKPHOS 36 (L) 04/26/2023 1631   BILITOT 0.5 04/26/2023 1631   GFRNONAA >60 04/26/2023 2349   GFRAA >90 02/19/2013 0635   Lipase     Component Value Date/Time   LIPASE 36 11/23/2021 1650       Studies/Results: CT ABDOMEN PELVIS W CONTRAST  Result Date: 04/26/2023 CLINICAL DATA:  Left  lower quadrant pain EXAM: CT ABDOMEN AND PELVIS WITH CONTRAST TECHNIQUE: Multidetector CT imaging of the abdomen and pelvis was performed using the standard protocol following bolus administration of intravenous contrast. RADIATION DOSE REDUCTION: This exam was performed according to the departmental dose-optimization program which includes automated exposure control, adjustment of the mA and/or kV according to patient size and/or use of iterative reconstruction technique. CONTRAST:  75mL OMNIPAQUE IOHEXOL 350 MG/ML SOLN COMPARISON:  CT 11/23/2021 FINDINGS: Lower chest: No acute abnormality. Hepatobiliary: Hepatic steatosis. No calcified gallstone or biliary dilatation Pancreas: Unremarkable. No pancreatic ductal dilatation or surrounding inflammatory changes. Spleen: Normal in size without focal abnormality. Adrenals/Urinary Tract: Adrenal glands are normal. Horseshoe kidney. No hydronephrosis. Bladder is normal Stomach/Bowel: Stomach is within normal limits. Appendix appears normal. No evidence of bowel wall thickening, distention, or inflammatory changes. Vascular/Lymphatic: Mild aortic atherosclerosis. No aneurysm. No suspicious lymph nodes. Reproductive: Prostate unremarkable Other: Negative for pelvic effusion or free air. Moderate fat stranding at the left inguinal region. Small fat containing right inguinal hernia. Moderate fat containing left inguinal hernia with stranding in the hernia sac. Musculoskeletal: No acute or suspicious osseous abnormality IMPRESSION: 1. Moderate fat containing left inguinal hernia with stranding in the hernia sac and at the left inguinal  region, findings suspicious for incarcerated fat containing left inguinal hernia. 2. Horseshoe kidney. 3. Hepatic steatosis. 4. Aortic atherosclerosis. Aortic Atherosclerosis (ICD10-I70.0). Electronically Signed   By: Jasmine Pang M.D.   On: 04/26/2023 18:08    Anti-infectives: Anti-infectives (From admission, onward)    Start      Dose/Rate Route Frequency Ordered Stop   04/27/23 1630  ceFAZolin (ANCEF) IVPB 1 g/50 mL premix        1 g 100 mL/hr over 30 Minutes Intravenous Every 8 hours 04/27/23 1533 05/04/23 1759        Assessment/Plan  Fat-containing incarcerated left inguinal (more likely femoral) hernia with significant pain.   - Unfortunately no room on the OR schedule today (here at [facility] or at [place]) due to more urgent OR cases today. His surgery has been pushed to tomorrow. - tentative plan for open hernia repair with mesh tomorrow 7/19 - Reg diet, NPO MN  - continue PRN analgesics, continue IV abx   LOS: 1 day   I reviewed nursing notes, hospitalist notes, last 24 h vitals and pain scores, last 48 h intake and output, last 24 h labs and trends, and last 24 h imaging results.  This care required straight-forward level of medical decision making.   Hosie Spangle, PA-C [facility] Please see Amion for pager number during day hours 7:00am-4:30pm

## 2023-04-28 NOTE — Progress Notes (Addendum)
   HD#1 SUBJECTIVE:  Patient Summary: Aaron [name] is a 57 y.o. with a pertinent PMH of anxiety and hypertension who presented with left inguinal swelling and admitted for incarcerated hernia.   Overnight Events: No acute events overnight  Interim History: Patient was evaluated at bedside.  Continues to endorse pain in left inguinal region that is controlled by current pain regimen.  Denies any nausea, vomiting, or other concerns.  OBJECTIVE:  Vital Signs: Vitals:   04/27/23 0849 04/27/23 1514 04/27/23 2037 04/28/23 0451  BP: 132/74 132/71 (!) 139/93 137/71  Pulse: 61 71 69 66  Resp: 18 17 18 18   Temp: 98.4 F (36.9 C) 98.2 F (36.8 C) 98.5 F (36.9 C) 98.3 F (36.8 C)  TempSrc: Oral Oral Oral Oral  SpO2: 96% 100% 95% 96%   Supplemental O2: Room Air SpO2: 96 %  There were no vitals filed for this visit.  No intake or output data in the 24 hours ending 04/28/23 0756 Net IO Since Admission: 240 mL [04/28/23 0756]  Physical Exam: Physical Exam Constitutional:      Appearance: Normal appearance.  HENT:     Head: Normocephalic and atraumatic.  Pulmonary:     Effort: Pulmonary effort is normal.  Abdominal:     General: Abdomen is flat.     Palpations: Abdomen is soft.     Hernia: A hernia is present. Hernia is present in the left inguinal area.  Neurological:     Mental Status: He is alert.   CBC    Component Value Date/Time   WBC 6.8 04/28/2023 0042   RBC 3.96 (L) 04/28/2023 0042   HGB 12.7 (L) 04/28/2023 0042   HGB 14.9 02/15/2023 1548   HCT 36.9 (L) 04/28/2023 0042   HCT 44.0 02/15/2023 1548   PLT 218 04/28/2023 0042   PLT 235 02/15/2023 1548   MCV 93.2 04/28/2023 0042   MCV 96 02/15/2023 1548   MCH 32.1 04/28/2023 0042   MCHC 34.4 04/28/2023 0042   RDW 12.3 04/28/2023 0042   RDW 12.3 02/15/2023 1548   LYMPHSABS 2.2 04/26/2023 1503   MONOABS 1.0 04/26/2023 1503   EOSABS 0.2 04/26/2023 1503   BASOSABS 0.1 04/26/2023 1503    ASSESSMENT/PLAN:   Assessment: Principal Problem:   Inguinal hernia with incarceration  Plan: # Left incarcerated inguinal versus femoral hernia Originally scheduled for OR today but moved to tomorrow due to unavailability.  Pain currently rolled with pain regimen.  Currently being followed by general surgery. -Open hernia repair with mesh tomorrow - N.p.o. at midnight -Continue current pain medications prn  #Generalized anxiety disorder -Continue sertraline 25 mg daily  #Hyperlipidemia Previously prescribed rosuvastatin 10 mg daily but not currently taking it. -Consider resuming rosuvastatin at discharge  Best Practice: Diet: N.p.o. at midnight VTE: SCDs Start: 04/26/23 2048 Code: Full AB: None DISPO: Anticipated discharge to Home pending surgery.  Signature: Morrie Sheldon, MD Internal Medicine Resident, PGY-1 [facility]  Pager: [phone]  Please contact the on call pager after 5 pm and on weekends at [phone].

## 2023-04-28 NOTE — Anesthesia Preprocedure Evaluation (Addendum)
Anesthesia Evaluation  Patient identified by MRN, date of birth, ID band Patient awake    Reviewed: Allergy & Precautions, NPO status , Patient's Chart, lab work & pertinent test results  History of Anesthesia Complications Negative for: history of anesthetic complications  Airway Mallampati: II  TM Distance: >3 FB Neck ROM: Full    Dental no notable dental hx. (+) Teeth Intact, Dental Advisory Given   Pulmonary neg shortness of breath, neg sleep apnea, neg COPD, neg recent URI, Current Smoker and Patient abstained from smoking.   Pulmonary exam normal breath sounds clear to auscultation       Cardiovascular hypertension, (-) angina (-) Past MI and (-) CHF Normal cardiovascular exam Rhythm:Regular Rate:Normal     Neuro/Psych  PSYCHIATRIC DISORDERS Anxiety Depression    negative neurological ROS     GI/Hepatic negative GI ROS, Neg liver ROS,,,  Endo/Other  HLD  Renal/GU negative Renal ROS  negative genitourinary   Musculoskeletal  (+) Arthritis ,  LIH   Abdominal   Peds  Hematology  (+) Blood dyscrasia, anemia Lab Results      Component                Value               Date                      WBC                      6.8                 04/28/2023                HGB                      12.7 (L)            04/28/2023                HCT                      36.9 (L)            04/28/2023                MCV                      93.2                04/28/2023                PLT                      218                 04/28/2023              Anesthesia Other Findings   Reproductive/Obstetrics                             Anesthesia Physical Anesthesia Plan  ASA: 2  Anesthesia Plan: General and Regional   Post-op Pain Management: Ofirmev IV (intra-op)*, Toradol IV (intra-op)*, Regional block* and Minimal or no pain anticipated   Induction: Intravenous  PONV Risk Score and Plan: 3  and Treatment may vary due to age or medical condition, Ondansetron and Dexamethasone  Airway  Management Planned: LMA and Oral ETT  Additional Equipment: None  Intra-op Plan:   Post-operative Plan: Extubation in OR  Informed Consent: I have reviewed the patients History and Physical, chart, labs and discussed the procedure including the risks, benefits and alternatives for the proposed anesthesia with the patient or authorized representative who has indicated his/her understanding and acceptance.     Dental advisory given  Plan Discussed with: CRNA  Anesthesia Plan Comments:        Anesthesia Quick Evaluation

## 2023-04-29 ENCOUNTER — Other Ambulatory Visit: Payer: Self-pay

## 2023-04-29 ENCOUNTER — Inpatient Hospital Stay (HOSPITAL_COMMUNITY): Payer: BLUE CROSS/BLUE SHIELD | Admitting: Anesthesiology

## 2023-04-29 ENCOUNTER — Encounter (HOSPITAL_COMMUNITY)
Admission: EM | Disposition: A | Discharge status: Home / Self Care | Payer: Self-pay | Source: Ambulatory Visit | Attending: Internal Medicine

## 2023-04-29 ENCOUNTER — Encounter (HOSPITAL_COMMUNITY): Payer: Self-pay | Admitting: Internal Medicine

## 2023-04-29 HISTORY — PX: INGUINAL HERNIA REPAIR: SHX194

## 2023-04-29 LAB — SURGICAL PCR SCREEN
MRSA, PCR: NEGATIVE
Staphylococcus aureus: NEGATIVE

## 2023-04-29 SURGERY — REPAIR, HERNIA, INGUINAL, ADULT
Anesthesia: General | Laterality: Left

## 2023-04-29 MED ORDER — ACETAMINOPHEN 160 MG/5ML PO SOLN: 1000.0000 mg | Freq: Once | ORAL | Status: DC | PRN

## 2023-04-29 MED ORDER — ACETAMINOPHEN 10 MG/ML IV SOLN
1000.0000 mg | Freq: Once | INTRAVENOUS | Status: DC | PRN
  Administered 2023-04-29: 1000 mg via INTRAVENOUS

## 2023-04-29 MED ORDER — HYDROXYZINE HCL 10 MG PO TABS: 10.0000 mg | ORAL_TABLET | Freq: Three times a day (TID) | ORAL | Status: DC | PRN

## 2023-04-29 MED ORDER — CAMPHOR-MENTHOL 0.5-0.5 % EX LOTN
TOPICAL_LOTION | CUTANEOUS | Status: DC | PRN
  Filled 2023-04-29: qty 222

## 2023-04-29 MED ORDER — ORAL CARE MOUTH RINSE: 15.0000 mL | Freq: Once | OROMUCOSAL | Status: AC

## 2023-04-29 MED ORDER — ONDANSETRON HCL 4 MG/2ML IJ SOLN
INTRAMUSCULAR | Status: DC | PRN
  Administered 2023-04-29: 4 mg via INTRAVENOUS

## 2023-04-29 MED ORDER — OXYCODONE HCL 5 MG PO TABS
5.0000 mg | ORAL_TABLET | ORAL | Status: DC | PRN
  Administered 2023-04-30: 5 mg via ORAL
  Filled 2023-04-29: qty 1

## 2023-04-29 MED ORDER — LIDOCAINE 2% (20 MG/ML) 5 ML SYRINGE
INTRAMUSCULAR | Status: DC | PRN
  Administered 2023-04-29: 60 mg via INTRAVENOUS

## 2023-04-29 MED ORDER — FENTANYL CITRATE (PF) 250 MCG/5ML IJ SOLN
INTRAMUSCULAR | Status: DC | PRN
  Administered 2023-04-29: 100 ug via INTRAVENOUS
  Administered 2023-04-29 (×3): 50 ug via INTRAVENOUS

## 2023-04-29 MED ORDER — DIPHENHYDRAMINE HCL 50 MG/ML IJ SOLN
12.5000 mg | Freq: Once | INTRAMUSCULAR | Status: AC
  Administered 2023-04-29: 12.5 mg via INTRAVENOUS
  Filled 2023-04-29: qty 1

## 2023-04-29 MED ORDER — OXYCODONE HCL 5 MG PO TABS: 5.0000 mg | ORAL_TABLET | Freq: Once | ORAL | Status: DC | PRN

## 2023-04-29 MED ORDER — ACETAMINOPHEN 500 MG PO TABS: 1000.0000 mg | ORAL_TABLET | Freq: Once | ORAL | Status: DC | PRN

## 2023-04-29 MED ORDER — ACETAMINOPHEN 500 MG PO TABS
1000.0000 mg | ORAL_TABLET | Freq: Once | ORAL | Status: AC
  Administered 2023-04-29: 1000 mg via ORAL
  Filled 2023-04-29: qty 2

## 2023-04-29 MED ORDER — FENTANYL CITRATE (PF) 250 MCG/5ML IJ SOLN
INTRAMUSCULAR | Status: AC
  Filled 2023-04-29: qty 5

## 2023-04-29 MED ORDER — ROCURONIUM BROMIDE 10 MG/ML (PF) SYRINGE
PREFILLED_SYRINGE | INTRAVENOUS | Status: DC | PRN
  Administered 2023-04-29: 60 mg via INTRAVENOUS

## 2023-04-29 MED ORDER — PROPOFOL 10 MG/ML IV BOLUS
INTRAVENOUS | Status: AC
  Filled 2023-04-29: qty 20

## 2023-04-29 MED ORDER — LACTATED RINGERS IV SOLN: INTRAVENOUS | Status: DC

## 2023-04-29 MED ORDER — FENTANYL CITRATE (PF) 100 MCG/2ML IJ SOLN
INTRAMUSCULAR | Status: AC
  Filled 2023-04-29: qty 2

## 2023-04-29 MED ORDER — DEXAMETHASONE SODIUM PHOSPHATE 10 MG/ML IJ SOLN
INTRAMUSCULAR | Status: DC | PRN
  Administered 2023-04-29: 4 mg via INTRAVENOUS

## 2023-04-29 MED ORDER — MIDAZOLAM HCL 5 MG/5ML IJ SOLN
INTRAMUSCULAR | Status: DC | PRN
  Administered 2023-04-29: 2 mg via INTRAVENOUS

## 2023-04-29 MED ORDER — BUPIVACAINE-EPINEPHRINE (PF) 0.5% -1:200000 IJ SOLN
INTRAMUSCULAR | Status: AC
  Filled 2023-04-29: qty 30

## 2023-04-29 MED ORDER — CEFAZOLIN SODIUM-DEXTROSE 2-4 GM/100ML-% IV SOLN
INTRAVENOUS | Status: AC
  Filled 2023-04-29: qty 100

## 2023-04-29 MED ORDER — LIDOCAINE-EPINEPHRINE (PF) 1.5 %-1:200000 IJ SOLN
INTRAMUSCULAR | Status: DC | PRN
  Administered 2023-04-29: 5 mL

## 2023-04-29 MED ORDER — CEFAZOLIN SODIUM-DEXTROSE 2-3 GM-%(50ML) IV SOLR
INTRAVENOUS | Status: DC | PRN
  Administered 2023-04-29: 2 g via INTRAVENOUS

## 2023-04-29 MED ORDER — FENTANYL CITRATE (PF) 100 MCG/2ML IJ SOLN
25.0000 ug | INTRAMUSCULAR | Status: DC | PRN
  Administered 2023-04-29: 50 ug via INTRAVENOUS

## 2023-04-29 MED ORDER — BUPIVACAINE-EPINEPHRINE 0.5% -1:200000 IJ SOLN
INTRAMUSCULAR | Status: DC | PRN
  Administered 2023-04-29: 10 mL

## 2023-04-29 MED ORDER — MIDAZOLAM HCL 2 MG/2ML IJ SOLN
INTRAMUSCULAR | Status: AC
  Filled 2023-04-29: qty 2

## 2023-04-29 MED ORDER — BUPIVACAINE-EPINEPHRINE (PF) 0.5% -1:200000 IJ SOLN
INTRAMUSCULAR | Status: DC | PRN
  Administered 2023-04-29: 25 mL

## 2023-04-29 MED ORDER — PROPOFOL 10 MG/ML IV BOLUS
INTRAVENOUS | Status: DC | PRN
Start: 2023-04-29 — End: 2023-04-29
  Administered 2023-04-29: 180 mg via INTRAVENOUS

## 2023-04-29 MED ORDER — ACETAMINOPHEN 10 MG/ML IV SOLN
INTRAVENOUS | Status: AC
  Filled 2023-04-29: qty 100

## 2023-04-29 MED ORDER — SUGAMMADEX SODIUM 200 MG/2ML IV SOLN
INTRAVENOUS | Status: DC | PRN
  Administered 2023-04-29: 173 mg via INTRAVENOUS

## 2023-04-29 MED ORDER — HYDROMORPHONE HCL 1 MG/ML IJ SOLN
0.5000 mg | INTRAMUSCULAR | Status: DC | PRN
  Administered 2023-04-29 – 2023-04-30 (×2): 1 mg via INTRAVENOUS
  Filled 2023-04-29: qty 1

## 2023-04-29 MED ORDER — CHLORHEXIDINE GLUCONATE 0.12 % MT SOLN
15.0000 mL | Freq: Once | OROMUCOSAL | Status: AC
  Administered 2023-04-29: 15 mL via OROMUCOSAL

## 2023-04-29 MED ORDER — SODIUM CHLORIDE 0.9 % IV SOLN: INTRAVENOUS | Status: DC

## 2023-04-29 MED ORDER — OXYCODONE HCL 5 MG/5ML PO SOLN: 5.0000 mg | Freq: Once | ORAL | Status: DC | PRN

## 2023-04-29 SURGICAL SUPPLY — 38 items
ADH SKN CLS APL DERMABOND .7 (GAUZE/BANDAGES/DRESSINGS) ×1
APL PRP STRL LF DISP 70% ISPRP (MISCELLANEOUS) ×1
BAG COUNTER SPONGE SURGICOUNT (BAG) ×1 IMPLANT
BAG SPNG CNTER NS LX DISP (BAG) ×1
BLADE CLIPPER SURG (BLADE) IMPLANT
CHLORAPREP W/TINT 26 (MISCELLANEOUS) ×1 IMPLANT
COVER SURGICAL LIGHT HANDLE (MISCELLANEOUS) ×1 IMPLANT
DERMABOND ADVANCED .7 DNX12 (GAUZE/BANDAGES/DRESSINGS) ×1 IMPLANT
DRAIN PENROSE 0.5X18 (DRAIN) ×1 IMPLANT
DRAPE LAPAROSCOPIC ABDOMINAL (DRAPES) ×1 IMPLANT
ELECT REM PT RETURN 9FT ADLT (ELECTROSURGICAL) ×1
ELECTRODE REM PT RTRN 9FT ADLT (ELECTROSURGICAL) ×1 IMPLANT
GLOVE BIO SURGEON STRL SZ7.5 (GLOVE) ×1 IMPLANT
GOWN STRL REUS W/ TWL LRG LVL3 (GOWN DISPOSABLE) ×2 IMPLANT
GOWN STRL REUS W/TWL LRG LVL3 (GOWN DISPOSABLE) ×2
KIT BASIN OR (CUSTOM PROCEDURE TRAY) ×1 IMPLANT
KIT TURNOVER KIT B (KITS) ×1 IMPLANT
NDL HYPO 25GX1X1/2 BEV (NEEDLE) ×1 IMPLANT
NEEDLE HYPO 25GX1X1/2 BEV (NEEDLE) ×1 IMPLANT
NS IRRIG 1000ML POUR BTL (IV SOLUTION) ×1 IMPLANT
PACK GENERAL/GYN (CUSTOM PROCEDURE TRAY) ×1 IMPLANT
PAD ARMBOARD 7.5X6 YLW CONV (MISCELLANEOUS) ×2 IMPLANT
PENCIL SMOKE EVACUATOR (MISCELLANEOUS) ×1 IMPLANT
SPIKE FLUID TRANSFER (MISCELLANEOUS) ×1 IMPLANT
SUT MNCRL AB 4-0 PS2 18 (SUTURE) ×1 IMPLANT
SUT PROLENE 2 0 SH DA (SUTURE) ×2 IMPLANT
SUT SILK 2 0 SH (SUTURE) IMPLANT
SUT SILK 3 0 (SUTURE) ×1
SUT SILK 3-0 18XBRD TIE 12 (SUTURE) ×1 IMPLANT
SUT VIC AB 0 CT1 27 (SUTURE) ×1
SUT VIC AB 0 CT1 27XBRD ANBCTR (SUTURE) ×1 IMPLANT
SUT VIC AB 2-0 SH 27 (SUTURE) ×1
SUT VIC AB 2-0 SH 27X BRD (SUTURE) ×1 IMPLANT
SUT VIC AB 3-0 SH 27 (SUTURE) ×1
SUT VIC AB 3-0 SH 27XBRD (SUTURE) ×1 IMPLANT
SYR CONTROL 10ML LL (SYRINGE) ×1 IMPLANT
TOWEL GREEN STERILE (TOWEL DISPOSABLE) ×1 IMPLANT
TOWEL GREEN STERILE FF (TOWEL DISPOSABLE) ×1 IMPLANT

## 2023-04-29 NOTE — H&P (View-Only) (Signed)
   Subjective/Chief Complaint: Still has some pain left groin   Objective: Vital signs in last 24 hours: Temp:  [97.9 F (36.6 C)-98.7 F (37.1 C)] 98.7 F (37.1 C) (07/19 0828) Pulse Rate:  [60-69] 62 (07/19 0828) Resp:  [16-17] 17 (07/19 0828) BP: (107-142)/(36-80) 137/67 (07/19 0828) SpO2:  [95 %-97 %] 97 % (07/19 0828) Last BM Date : 04/27/23  Intake/Output from previous day: 07/18 0701 - 07/19 0700 In: 440 [P.O.:240; IV Piggyback:200] Out: -  Intake/Output this shift: No intake/output data recorded.  General appearance: alert and cooperative Resp: clear to auscultation bilaterally Cardio: regular rate and rhythm GI: soft. Hernia left groin does not reduce. No sign of obstruction . Still has some cellulitis  Lab Results:  Recent Labs    04/26/23 2349 04/28/23 0042  WBC 6.7 6.8  HGB 12.6* 12.7*  HCT 37.2* 36.9*  PLT 217 218   BMET Recent Labs    04/26/23 1503 04/26/23 2349  NA 138 140  K 3.8 4.3  CL 105 105  CO2 24 28  GLUCOSE 94 97  BUN 17 17  CREATININE 1.14 1.11  CALCIUM 8.8* 8.8*   PT/INR No results for input(s): "LABPROT", "INR" in the last 72 hours. ABG No results for input(s): "PHART", "HCO3" in the last 72 hours.  Invalid input(s): "PCO2", "PO2"  Studies/Results: No results found.  Anti-infectives: Anti-infectives (From admission, onward)    Start     Dose/Rate Route Frequency Ordered Stop   04/27/23 1630  ceFAZolin (ANCEF) IVPB 1 g/50 mL premix        1 g 100 mL/hr over 30 Minutes Intravenous Every 8 hours 04/27/23 1533 05/04/23 1759       Assessment/Plan: s/p Procedure(s): OPEN HERNIA REPAIR INGUINAL (Left) Will try for OR today if schedule allows given the computer outages I explained this to the patient and he understands. If we can not operate today then we will feed him and reschedule  LOS: 2 days    Chevis Pretty III 04/29/2023

## 2023-04-29 NOTE — Progress Notes (Addendum)
Paged because Aaron [name] has a rash over the face and abdomen. The rash on the abdomen is itchy and started two days ago after he got pain medication. The one on his face started today after surgery. No SOB or wheezing, no swelling. On exam he has petechiae over the bilateral cheeks sparing the nasolabial folds. May be due to mask during surgery. On the abdomen he has two erythematous excoriated plaques. Lungs were clear to auscultation. Patient was instructed to look for signs of wheezing, difficulty breathing, swelling of the face, tongue, lips and to notify us immediately if he noticed any of these symptoms. Benadryl and Sarna lotion ordered.

## 2023-04-29 NOTE — Progress Notes (Signed)
Pt reporting new rash on face (asymptomatic just red) says started after surgery. Also reporting rash on abdomen and upper chest that is itching. Pt states the rash on their abdomen has been itching for a few days since beginning pain medications but is unclear which one is causing it. Pt denies itching/burning/pain anywhere else. Pt denies SOB, dizziness, wheezing and discomfort. Provider paged and at bedside.

## 2023-04-29 NOTE — Interval H&P Note (Signed)
History and Physical Interval Note:  04/29/2023 1:57 PM  Aaron [name]  has presented today for surgery, with the diagnosis of left inguinal hernia.  The various methods of treatment have been discussed with the patient and family. After consideration of risks, benefits and other options for treatment, the patient has consented to  Procedure(s): OPEN HERNIA REPAIR INGUINAL (Left) as a surgical intervention.  The patient's history has been reviewed, patient examined, no change in status, stable for surgery.  I have reviewed the patient's chart and labs.  Questions were answered to the patient's satisfaction.     Chevis Pretty III

## 2023-04-29 NOTE — Progress Notes (Signed)
   HD#2 SUBJECTIVE:  Patient Summary: Aaron [name] is a 57 y.o. with a pertinent PMH of anxiety and hypertension who presented with left inguinal swelling and admitted for incarcerated hernia.   Overnight Events: No acute events overnight  Interim History: Patient was evaluated at bedside.  Continues to endorse pain in left inguinal region that is controlled by current pain regimen.  Denies any nausea, vomiting, or other concerns. Had not passed a stool but reported gas.   OBJECTIVE:  Vital Signs: Vitals:   04/28/23 2056 04/29/23 0443 04/29/23 0828 04/29/23 1346  BP: (!) 141/80 (!) 142/77 137/67 (!) 151/72  Pulse: 65 60 62 68  Resp: 16 16 17 20   Temp: 98.4 F (36.9 C) 97.9 F (36.6 C) 98.7 F (37.1 C) 98.7 F (37.1 C)  TempSrc: Oral Oral Oral Oral  SpO2: 95% 96% 97% 93%   Supplemental O2: Room Air SpO2: 93 %  There were no vitals filed for this visit.   Intake/Output Summary (Last 24 hours) at 04/29/2023 1524 Last data filed at 04/29/2023 1508 Gross per 24 hour  Intake 1490 ml  Output 10 ml  Net 1480 ml   Net IO Since Admission: 1,720 mL [04/29/23 1524]  Physical Exam: Physical Exam Constitutional:      Appearance: Normal appearance.  HENT:     Head: Normocephalic and atraumatic.  Pulmonary:     Effort: Pulmonary effort is normal.  Abdominal:     General: Abdomen is flat.     Palpations: Abdomen is soft.     Hernia: A hernia is present. Hernia is present in the left inguinal area.  Neurological:     Mental Status: He is alert.     ASSESSMENT/PLAN:  Assessment: Principal Problem:   Inguinal hernia with incarceration  Plan: # Left incarcerated inguinal versus femoral hernia, resolved General surgery completed hernia repair of the left groin today. Pain currently controlled with pain regimen. Per general surgery, patient is being held overnight for observation.  - Continue current pain medications prn - Monitor for increased pain or discomfort with the  incision   #Generalized anxiety disorder -Continue sertraline 25 mg daily  #Hyperlipidemia Previously prescribed rosuvastatin 10 mg daily but not currently taking it. -Consider resuming rosuvastatin at discharge  Best Practice: Diet: N.p.o. at midnight VTE: SCDs Start: 04/26/23 2048 Code: Full AB: None DISPO: Anticipated discharge to Home pending surgery.  Signature: Morrie Sheldon, MD Internal Medicine Resident, PGY-1 [facility]  Pager: [phone]  Please contact the on call pager after 5 pm and on weekends at [phone].

## 2023-04-29 NOTE — Transfer of Care (Signed)
Immediate Anesthesia Transfer of Care Note  Patient: Aaron [name]  Procedure(s) Performed: OPEN HERNIA REPAIR INGUINAL (Left)  Patient Location: PACU  Anesthesia Type:General  Level of Consciousness: awake and alert   Airway & Oxygen Therapy: Patient Spontanous Breathing and Patient connected to face mask oxygen  Post-op Assessment: Report given to RN and Post -op Vital signs reviewed and stable  Post vital signs: Reviewed and stable  Last Vitals:  Vitals Value Taken Time  BP 138/77 04/29/23 1525  Temp    Pulse 82 04/29/23 1527  Resp 11 04/29/23 1527  SpO2 99 % 04/29/23 1527  Vitals shown include unfiled device data.  Last Pain:  Vitals:   04/29/23 1346  TempSrc: Oral  PainSc: 4       Patients Stated Pain Goal: 3 (04/29/23 0654)  Complications: No notable events documented.

## 2023-04-29 NOTE — Anesthesia Procedure Notes (Signed)
Procedure Name: Intubation Date/Time: 04/29/2023 2:17 PM  Performed by: Rachel Moulds, CRNAPre-anesthesia Checklist: Patient identified, Emergency Drugs available, Suction available, Patient being monitored and Timeout performed Patient Re-evaluated:Patient Re-evaluated prior to induction Oxygen Delivery Method: Circle system utilized Preoxygenation: Pre-oxygenation with 100% oxygen Induction Type: IV induction Ventilation: Mask ventilation without difficulty Laryngoscope Size: Mac and 4 Grade View: Grade I Tube type: Oral Tube size: 7.5 mm Number of attempts: 1 Airway Equipment and Method: Stylet Placement Confirmation: ETT inserted through vocal cords under direct vision, positive ETCO2, CO2 detector and breath sounds checked- equal and bilateral Secured at: 23 cm Tube secured with: Tape Dental Injury: Teeth and Oropharynx as per pre-operative assessment

## 2023-04-29 NOTE — Progress Notes (Signed)
   Subjective/Chief Complaint: Still has some pain left groin   Objective: Vital signs in last 24 hours: Temp:  [97.9 F (36.6 C)-98.7 F (37.1 C)] 98.7 F (37.1 C) (07/19 0828) Pulse Rate:  [60-69] 62 (07/19 0828) Resp:  [16-17] 17 (07/19 0828) BP: (107-142)/(36-80) 137/67 (07/19 0828) SpO2:  [95 %-97 %] 97 % (07/19 0828) Last BM Date : 04/27/23  Intake/Output from previous day: 07/18 0701 - 07/19 0700 In: 440 [P.O.:240; IV Piggyback:200] Out: -  Intake/Output this shift: No intake/output data recorded.  General appearance: alert and cooperative Resp: clear to auscultation bilaterally Cardio: regular rate and rhythm GI: soft. Hernia left groin does not reduce. No sign of obstruction . Still has some cellulitis  Lab Results:  Recent Labs    04/26/23 2349 04/28/23 0042  WBC 6.7 6.8  HGB 12.6* 12.7*  HCT 37.2* 36.9*  PLT 217 218   BMET Recent Labs    04/26/23 1503 04/26/23 2349  NA 138 140  K 3.8 4.3  CL 105 105  CO2 24 28  GLUCOSE 94 97  BUN 17 17  CREATININE 1.14 1.11  CALCIUM 8.8* 8.8*   PT/INR No results for input(s): "LABPROT", "INR" in the last 72 hours. ABG No results for input(s): "PHART", "HCO3" in the last 72 hours.  Invalid input(s): "PCO2", "PO2"  Studies/Results: No results found.  Anti-infectives: Anti-infectives (From admission, onward)    Start     Dose/Rate Route Frequency Ordered Stop   04/27/23 1630  ceFAZolin (ANCEF) IVPB 1 g/50 mL premix        1 g 100 mL/hr over 30 Minutes Intravenous Every 8 hours 04/27/23 1533 05/04/23 1759       Assessment/Plan: s/p Procedure(s): OPEN HERNIA REPAIR INGUINAL (Left) Will try for OR today if schedule allows given the computer outages I explained this to the patient and he understands. If we can not operate today then we will feed him and reschedule  LOS: 2 days    Chevis Pretty III 04/29/2023

## 2023-04-29 NOTE — Plan of Care (Signed)
  Problem: Education: Goal: Knowledge of General Education information will improve Description: Including pain rating scale, medication(s)/side effects and non-pharmacologic comfort measures Outcome: Progressing   Problem: Clinical Measurements: Goal: Ability to maintain clinical measurements within normal limits will improve Outcome: Progressing   Problem: Activity: Goal: Risk for activity intolerance will decrease Outcome: Progressing   Problem: Coping: Goal: Level of anxiety will decrease Outcome: Progressing   Problem: Pain Managment: Goal: General experience of comfort will improve Outcome: Progressing   Problem: Safety: Goal: Ability to remain free from injury will improve Outcome: Progressing   Problem: Skin Integrity: Goal: Risk for impaired skin integrity will decrease Outcome: Progressing

## 2023-04-29 NOTE — Anesthesia Procedure Notes (Signed)
Anesthesia Regional Block: TAP block   Pre-Anesthetic Checklist: , timeout performed,  Correct Patient, Correct Site, Correct Laterality,  Correct Procedure,, site marked,  At surgeon's request  Laterality: Left and Upper  Prep: chloraprep       Needles:  Injection technique: Single-shot  Needle Type: Echogenic Needle     Needle Length: 9cm  Needle Gauge: 20     Additional Needles:   Procedures:,,,, ultrasound used (permanent image in chart),,    Narrative:  Start time: 04/29/2023 2:10 PM End time: 04/29/2023 2:16 PM  Events: injection resistance  Performed by: Personally  Anesthesiologist: Trevor Iha, MD

## 2023-04-29 NOTE — Discharge Summary (Signed)
   Name: Aaron [name] MRN: [id number] DOB: 12-Aug-1966 57 y.o. PCP: Rudene Christians, DO  Date of Admission: 04/26/2023  2:35 PM Date of Discharge: No discharge date for patient encounter. Attending Physician: Dickie La, MD  Discharge Diagnosis: 1. Principal Problem:   Inguinal hernia with incarceration  Discharge Medications: Allergies as of 04/29/2023   No Known Allergies   Med Rec must be completed prior to using this [facility]***      Disposition and follow-up:   Aaron [name] was discharged from [facility] in Stable condition.  At the hospital follow up visit please address:  1.    A. Left incarcerated inguinal vs femoral hernia: Ask if pain has resolved or any concerns with procedure   B. Generalized Anxiety Disorder: Ask if change in mood/affect and continuing to take Zoloft   C. Hyperlipidemia: Ask if continuing to take rosuvastatin   2.  Labs / imaging needed at time of follow-up: None  3.  Pending labs/ test needing follow-up: None   4. Medications: Continue to take:  - Crestor 10 mg by mouth daily  - Hydroxyzine 25 mg by mouth three times daily  - Zoloft 25 mg by mouth daily  Follow-up Appointments:  Follow-up Information     Morene Crocker, MD Follow up.   Specialty: Internal Medicine Why: Scheduled for 8/2 at 10:15 AM Contact information: [address] [place] [address] [phone]                Hospital Course by problem list:  #Left Incarcerated Inguinal Hernia Patient presented with pinching pain in his left groin that had been gradually enlarging and more painful. On CT, he was found to have a left inguinal hernia. General surgery initially advised outpatient follow-up plan for elective pair for significant pain was limiting his activity. He was given Dilaudid and Tylenol for pain. His surgery was postponed for several days as the OR was unavailable. At discharge, general surgery had treated his hernia.    #Generalized Anxiety Disorder Patient has a history of GAD and was continued on his home sertraline 25 mg daily.   #History of Hyperlipidemia  Patient was previous prescribed 10 mg daily but he reported that he was not currently taking it. At discharge, consider resuming rosuvastatin.   Discharge Exam:   BP (!) 151/72   Pulse 68   Temp 98.7 F (37.1 C) (Oral)   Resp 20   SpO2 93%  Discharge exam: Patient was evaluated at bedside. He endorsed continued lower left inguinal pain unchanged from the previous days. He denies any abdominal pain, chest pain, or other signs, symptoms, or concerns.   (04/26/23) CLINICAL DATA:  Left lower quadrant pain EXAM: CT ABDOMEN AND PELVIS WITH CONTRAST IMPRESSION: 1. Moderate fat containing left inguinal hernia with stranding in the hernia sac and at the left inguinal region, findings suspicious for incarcerated fat containing left inguinal hernia 2. Horseshoe kidney 3. Hepatic steatosis 4. Aortic atherosclerosis  Discharge Instructions:   Signed: Morrie Sheldon, MD 04/29/2023, 2:10 PM   Pager: [phone]

## 2023-04-29 NOTE — Op Note (Signed)
04/26/2023 - 04/29/2023  3:17 PM  PATIENT:  Aaron [name]  57 y.o. male  PRE-OPERATIVE DIAGNOSIS:  left femoral hernia  POST-OPERATIVE DIAGNOSIS:  left femoral hernia  PROCEDURE:  Procedure(s): OPEN HERNIA REPAIR INGUINAL (Left)  SURGEON:  Surgeons and Role:    Griselda Miner, MD - Primary  PHYSICIAN ASSISTANT:   ASSISTANTS: none   ANESTHESIA:   local and general  EBL:  10 mL   BLOOD ADMINISTERED:none  DRAINS: none   LOCAL MEDICATIONS USED:  MARCAINE     SPECIMEN:  No Specimen  DISPOSITION OF SPECIMEN:  N/A  COUNTS:  YES  TOURNIQUET:  * No tourniquets in log *  DICTATION: .Dragon Dictation  After informed consent was obtained the patient was brought to the operating room and placed in the supine position on the operating table.  After adequate induction of general anesthesia the patient's left groin and abdomen were prepped with ChloraPrep, allowed to dry, and draped in usual sterile manner.  An appropriate timeout was performed.  The left groin was then infiltrated with quarter percent Marcaine.  A small incision was made from the edge of the pubic tubercle on the left towards the anterior superior iliac spine with a 15 blade knife.  The incision was carried through the skin and subcutaneous tissue sharply with the electrocautery until the fascia of the external oblique was encountered.  The fascia was followed down to the inguinal ligament.  There was a moderate sized hernia coming from beneath the inguinal ligament into the femoral space.  After several minutes of manipulation I was able to reduce the hernia back beneath the inguinal ligament.  The space medial to the vessels was then closed with interrupted 2-0 Prolene stitches.  Hemostasis was achieved using the Bovie electrocautery.  The subcutaneous fascia was then closed with a running 2-0 Vicryl stitch.  I elected not to use any mesh due to the fact that the patient had cellulitis in that space.  The skin was then  closed with a running 4-0 Monocryl subcuticular stitch.  Dermabond dressings were applied.  The patient tolerated the procedure well.  At the end of the case all needle sponge and instrument counts were correct.  The patient was then awakened and taken to recovery in stable condition.  PLAN OF CARE: Admit for overnight observation  PATIENT DISPOSITION:  PACU - hemodynamically stable.   Delay start of Pharmacological VTE agent (>24hrs) due to surgical blood loss or risk of bleeding: no

## 2023-04-30 ENCOUNTER — Encounter (HOSPITAL_COMMUNITY): Payer: Self-pay | Admitting: General Surgery

## 2023-04-30 LAB — CBC
HCT: 34.5 % — ABNORMAL LOW (ref 39.0–52.0)
Hemoglobin: 12 g/dL — ABNORMAL LOW (ref 13.0–17.0)
MCH: 33.1 pg (ref 26.0–34.0)
MCHC: 34.8 g/dL (ref 30.0–36.0)
MCV: 95.3 fL (ref 80.0–100.0)
Platelets: 220 10*3/uL (ref 150–400)
RBC: 3.62 MIL/uL — ABNORMAL LOW (ref 4.22–5.81)
RDW: 12.3 % (ref 11.5–15.5)
WBC: 9.5 10*3/uL (ref 4.0–10.5)
nRBC: 0 % (ref 0.0–0.2)

## 2023-04-30 LAB — BASIC METABOLIC PANEL
Anion gap: 7 (ref 5–15)
BUN: 12 mg/dL (ref 6–20)
CO2: 25 mmol/L (ref 22–32)
Calcium: 8.6 mg/dL — ABNORMAL LOW (ref 8.9–10.3)
Chloride: 106 mmol/L (ref 98–111)
Creatinine, Ser: 0.97 mg/dL (ref 0.61–1.24)
GFR, Estimated: >60 mL/min (ref >60)
Glucose, Bld: 134 mg/dL — ABNORMAL HIGH (ref 70–99)
Potassium: 4.3 mmol/L (ref 3.5–5.1)
Sodium: 138 mmol/L (ref 135–145)

## 2023-04-30 MED ORDER — SENNA 8.6 MG PO TABS: 1.0000 | ORAL_TABLET | Freq: Two times a day (BID) | ORAL | 0 refills | Status: AC

## 2023-04-30 MED ORDER — METHOCARBAMOL 500 MG PO TABS: 500.0000 mg | ORAL_TABLET | Freq: Four times a day (QID) | ORAL | 0 refills | Status: AC | PRN

## 2023-04-30 MED ORDER — ACETAMINOPHEN 500 MG PO TABS: 500.0000 mg | ORAL_TABLET | Freq: Four times a day (QID) | ORAL | Status: AC | PRN

## 2023-04-30 MED ORDER — DIPHENHYDRAMINE HCL 50 MG PO TABS: 50.0000 mg | ORAL_TABLET | Freq: Every evening | ORAL | 0 refills | Status: AC | PRN

## 2023-04-30 MED ORDER — CEFADROXIL 500 MG PO CAPS: 500.0000 mg | ORAL_CAPSULE | Freq: Two times a day (BID) | ORAL | 0 refills | Status: AC

## 2023-04-30 MED ORDER — OXYCODONE HCL 5 MG PO TABS: 5.0000 mg | ORAL_TABLET | Freq: Four times a day (QID) | ORAL | 0 refills | Status: AC | PRN

## 2023-04-30 NOTE — Progress Notes (Addendum)
[facility] Progress Note  1 Day Post-Op  Subjective: CC:  Pain controlled. Tolerating PO. +flatus. No Bm since admission.   He gets itching with codeine and has scratched his abdomen so much that he has a rash there. He states that this has happened to him before. He also c/o new redness over his cheeks, not there before surgery. The area is not itchy, swollen, or painful.   Objective: Vital signs in last 24 hours: Temp:  [97.9 F (36.6 C)-98.7 F (37.1 C)] 97.9 F (36.6 C) (07/20 0820) Pulse Rate:  [68-86] 70 (07/20 0820) Resp:  [11-20] 16 (07/20 0820) BP: (123-175)/(72-94) 155/93 (07/20 0820) SpO2:  [92 %-99 %] 96 % (07/20 0820) Last BM Date : 04/27/23  Intake/Output from previous day: 07/19 0701 - 07/20 0700 In: 2015 [P.O.:240; I.V.:1575; IV Piggyback:200] Out: 10 [Blood:10] Intake/Output this shift: Total I/O In: 480 [P.O.:480] Out: -   PE: Gen:  Alert, NAD, pleasant  Abd: Soft, non-tender, non-distended, LIH with incision c/d/I, erythema improved.  Skin: warm and dry, no rashes  Psych: A&Ox3   Lab Results:  Recent Labs    04/28/23 0042 04/30/23 0112  WBC 6.8 9.5  HGB 12.7* 12.0*  HCT 36.9* 34.5*  PLT 218 220   BMET Recent Labs    04/30/23 0112  NA 138  K 4.3  CL 106  CO2 25  GLUCOSE 134*  BUN 12  CREATININE 0.97  CALCIUM 8.6*   PT/INR No results for input(s): "LABPROT", "INR" in the last 72 hours. CMP     Component Value Date/Time   NA 138 04/30/2023 0112   NA 145 (H) 02/15/2023 1548   K 4.3 04/30/2023 0112   CL 106 04/30/2023 0112   CO2 25 04/30/2023 0112   GLUCOSE 134 (H) 04/30/2023 0112   BUN 12 04/30/2023 0112   BUN 10 02/15/2023 1548   CREATININE 0.97 04/30/2023 0112   CALCIUM 8.6 (L) 04/30/2023 0112   PROT 6.0 (L) 04/26/2023 1631   ALBUMIN 3.1 (L) 04/26/2023 1631   AST 20 04/26/2023 1631   ALT 22 04/26/2023 1631   ALKPHOS 36 (L) 04/26/2023 1631   BILITOT 0.5 04/26/2023 1631   GFRNONAA >60 04/30/2023 0112    GFRAA >90 02/19/2013 0635   Lipase     Component Value Date/Time   LIPASE 36 11/23/2021 1650       Studies/Results: No results found.  Anti-infectives: Anti-infectives (From admission, onward)    Start     Dose/Rate Route Frequency Ordered Stop   04/29/23 1359  ceFAZolin (ANCEF) 2-4 GM/100ML-% IVPB       Note to Pharmacy: [facility] L: cabinet override      04/29/23 1359 04/30/23 0159   04/27/23 1630  ceFAZolin (ANCEF) IVPB 1 g/50 mL premix        1 g 100 mL/hr over 30 Minutes Intravenous Every 8 hours 04/27/23 1533 05/04/23 2159        Assessment/Plan  Fat-containing incarcerated left inguinal (more likely femoral) hernia with significant pain.  S/p open primary repair of hernia 7/19 Dr. Carolynne Edouard  Afebrile, VSS, pain controlled Stable for discharge. Work note, pain Rx, follow up provided. No further abx recommended   LOS: 3 days   I reviewed nursing notes, hospitalist notes, last 24 h vitals and pain scores, last 48 h intake and output, last 24 h labs and trends, and last 24 h imaging results.  This care required straight-forward level of medical decision making.   Hosie Spangle, PA-C  [facility] Please see Amion for pager number during day hours 7:00am-4:30pm

## 2023-04-30 NOTE — Discharge Instructions (Signed)

## 2023-04-30 NOTE — Anesthesia Postprocedure Evaluation (Signed)
Anesthesia Post Note  Patient: Aaron [name]  Procedure(s) Performed: OPEN HERNIA REPAIR INGUINAL (Left)     Patient location during evaluation: PACU Anesthesia Type: General Level of consciousness: awake and alert Pain management: pain level controlled Vital Signs Assessment: post-procedure vital signs reviewed and stable Respiratory status: spontaneous breathing, nonlabored ventilation, respiratory function stable and patient connected to nasal cannula oxygen Cardiovascular status: blood pressure returned to baseline and stable Postop Assessment: no apparent nausea or vomiting Anesthetic complications: no   No notable events documented.  Last Vitals:  Vitals:   04/29/23 1647 04/29/23 1939  BP: (!) 160/89 (!) 175/83  Pulse: 83 85  Resp: 16 16  Temp: 36.7 C 36.8 C  SpO2: 96% 98%    Last Pain:  Vitals:   04/29/23 1939  TempSrc: Oral  PainSc: 6                  Trevor Iha

## 2023-04-30 NOTE — Progress Notes (Signed)
Pt reports itchiness has improved and pain is well controlled. Rashes appear unchanged. Provider notified.

## 2023-05-13 ENCOUNTER — Encounter: Payer: Self-pay | Admitting: Student

## 2023-05-13 ENCOUNTER — Ambulatory Visit (INDEPENDENT_AMBULATORY_CARE_PROVIDER_SITE_OTHER): Payer: BLUE CROSS/BLUE SHIELD | Admitting: Student

## 2023-05-13 ENCOUNTER — Other Ambulatory Visit: Payer: Self-pay

## 2023-05-13 VITALS — BP 118/58 | HR 58 | Temp 98.3°F | Ht 70.0 in | Wt 195.8 lb

## 2023-05-13 DIAGNOSIS — R001 Bradycardia, unspecified: Secondary | ICD-10-CM | POA: Diagnosis not present

## 2023-05-13 DIAGNOSIS — K4030 Unilateral inguinal hernia, with obstruction, without gangrene, not specified as recurrent: Secondary | ICD-10-CM | POA: Diagnosis not present

## 2023-05-13 DIAGNOSIS — Z0000 Encounter for general adult medical examination without abnormal findings: Secondary | ICD-10-CM

## 2023-05-13 DIAGNOSIS — E785 Hyperlipidemia, unspecified: Secondary | ICD-10-CM | POA: Diagnosis not present

## 2023-05-13 DIAGNOSIS — Z1211 Encounter for screening for malignant neoplasm of colon: Secondary | ICD-10-CM

## 2023-05-13 MED ORDER — ROSUVASTATIN CALCIUM 10 MG PO TABS
10.0000 mg | ORAL_TABLET | Freq: Every day | ORAL | 11 refills | Status: AC
Start: 2023-05-13 — End: 2024-05-12

## 2023-05-13 NOTE — Assessment & Plan Note (Signed)
Will hold off on GI referral to colonoscopy until recovered from recent L inguinal hernia repair

## 2023-05-13 NOTE — Progress Notes (Signed)
Subjective:  CC: HFU  HPI:  Aaron [name] is a 57 y.o. male with a past medical history stated below and presents today for HFU after L inguinal hernia repair. Please see problem based assessment and plan for additional details.  Past Medical History:  Diagnosis Date   Anxiety    Hyperlipidemia    Hypertension    Rotator cuff injury 02/18/2013   Right side, untreated   Syncope    Likely Vasovagal    Current Outpatient Medications on File Prior to Visit  Medication Sig Dispense Refill   acetaminophen (TYLENOL) 500 MG tablet Take 1-2 tablets (500-1,000 mg total) by mouth every 6 (six) hours as needed for mild pain.     diphenhydrAMINE (BENADRYL) 50 MG tablet Take 1 tablet (50 mg total) by mouth at bedtime as needed for up to 7 days for itching. 7 tablet 0   hydrOXYzine (ATARAX) 25 MG tablet Take 1 tablet (25 mg total) by mouth 3 (three) times daily as needed. (Patient taking differently: Take 25 mg by mouth 3 (three) times daily as needed for anxiety or itching.) 30 tablet 2   methocarbamol (ROBAXIN) 500 MG tablet Take 1 tablet (500 mg total) by mouth every 6 (six) hours as needed for muscle spasms. 40 tablet 0   oxyCODONE (OXY IR/ROXICODONE) 5 MG immediate release tablet Take 1 tablet (5 mg total) by mouth every 6 (six) hours as needed for moderate pain (not relieved by tylenol or robaxin). 15 tablet 0   Potassium 99 MG TABS Take 99 mg by mouth daily.     sertraline (ZOLOFT) 25 MG tablet Take 1 tablet (25 mg total) by mouth daily. 30 tablet 11   No current facility-administered medications on file prior to visit.    Family History  Problem Relation Age of Onset   Lung cancer Father    Parkinson's disease Sister     Social History   Socioeconomic History   Marital status: Widowed    Spouse name: Not on file   Number of children: Not on file   Years of education: Not on file   Highest education level: Not on file  Occupational History   Not on file  Tobacco Use    Smoking status: Every Day    Current packs/day: 0.40    Average packs/day: 0.4 packs/day for 30.0 years (12.0 ttl pk-yrs)    Types: Cigarettes   Smokeless tobacco: Never   Tobacco comments:    5 per day  Vaping Use   Vaping status: Never Used  Substance and Sexual Activity   Alcohol use: Yes    Comment: rarely   Drug use: Yes    Frequency: 1.0 times per week    Types: Marijuana   Sexual activity: Yes    Birth control/protection: None  Other Topics Concern   Not on file  Social History Narrative   Not on file   Social Determinants of Health   Financial Resource Strain: Not on file  Food Insecurity: No Food Insecurity (04/26/2023)   Hunger Vital Sign    Worried About Running Out of Food in the Last Year: Never true    Ran Out of Food in the Last Year: Never true  Transportation Needs: No Transportation Needs (02/02/2023)   PRAPARE - Administrator, Civil Service (Medical): No    Lack of Transportation (Non-Medical): No  Physical Activity: Not on file  Stress: Not on file  Social Connections: Not on file  Intimate  Partner Violence: Not At Risk (02/02/2023)   Humiliation, Afraid, Rape, and Kick questionnaire    Fear of Current or Ex-Partner: No    Emotionally Abused: No    Physically Abused: No    Sexually Abused: No    Review of Systems: ROS negative except for what is noted on the assessment and plan.  Objective:   Vitals:   05/13/23 1009  BP: (!) 118/58  Pulse: (!) 58  Temp: 98.3 F (36.8 C)  TempSrc: Oral  SpO2: 100%  Weight: 195 lb 12.8 oz (88.8 kg)  Height: 5\' 10"  (1.778 m)    Physical Exam: Constitutional: well-appearing laying in exam bed in NAD HENT: mucous membranes moist Eyes: conjunctiva non-erythematous Neck: supple Cardiovascular: regular rate and rhythm, no m/r/g Pulmonary/Chest: normal work of breathing on room air, lungs clear to auscultation bilaterally Abdominal: soft, non-tender, non-distended MSK: normal bulk and  tone Neurological: alert & oriented x 3 Skin: warm and dry. Uncovered surgical incision overlaying L inguinal without erythema, fluctuance, or tenderness. It is cleaned and dry. Psych: Pleasant mood and affect       04/26/2023    8:55 AM  Depression screen PHQ 2/9  Decreased Interest 0  Down, Depressed, Hopeless 0  PHQ - 2 Score 0  Altered sleeping 0  Tired, decreased energy 0  Change in appetite 0  Feeling bad or failure about yourself  0  Trouble concentrating 0  Moving slowly or fidgety/restless 0  Suicidal thoughts 0  PHQ-9 Score 0  Difficult doing work/chores Not difficult at all        No data to display           Assessment & Plan:   Inguinal hernia with incarceration Patient has been recovering from his recent L inguinal surgical repair without complications. Denies pain/has not needed to take pain medications, 1-2 formed BM per day, and denies nausea or vomiting. Initial irritation on skin over incision has now cleared.  Patient is scheduled to be seen with General Surgery on 05/24/2023; he is now aware and has address and phone number to contact surgeons.  On exam, surgical scar at appropriate stage of healing without evidence of infection. Plan: -Follow up with General surgery -Continue caring for incision site and monitor for signs of infection  HLD (hyperlipidemia) Refilled Crestor today  Bradycardia Not symptomatic today. Denies presyncopal or syncopal episodes or exercise intolerance since his previous hospitalization for syncope. Patient wore Zio patch without events and is followed by Cardiology.  Patient was recently evaluated with AM cortisol, 4.7, which was collected at 9 am. However, no evidence of hyponatremia, hyperkalemia on subsequent tests. He was also able to undergo surgery without an adverse effect. For this reason, there is low clinical suspicion that this patient has adrenal insufficiency. Thus, will not repeat study at this time. Will consider  if recurrence of previous symptoms. -Continue to monitor at follow up visits   Healthcare maintenance Will hold off on GI referral to colonoscopy until recovered from recent L inguinal hernia repair   Return in about 3 months (around 08/13/2023) for follow up on chronic medical conditions.  Patient discussed with Dr. Rosalia Hammers, MD [facility] Internal Medicine Program - PGY-2 05/13/2023, 2:59 PM

## 2023-05-13 NOTE — Assessment & Plan Note (Signed)
Refilled Crestor today

## 2023-05-13 NOTE — Assessment & Plan Note (Addendum)
Not symptomatic today. Denies presyncopal or syncopal episodes or exercise intolerance since his previous hospitalization for syncope. Patient wore Zio patch without events and is followed by Cardiology.  Patient was recently evaluated with AM cortisol, 4.7, which was collected at 9 am. However, no evidence of hyponatremia, hyperkalemia on subsequent tests. He was also able to undergo surgery without an adverse effect. For this reason, there is low clinical suspicion that this patient has adrenal insufficiency. Thus, will not repeat study at this time. Will consider if recurrence of previous symptoms. -Continue to monitor at follow up visits

## 2023-05-13 NOTE — Assessment & Plan Note (Signed)
Patient has been recovering from his recent L inguinal surgical repair without complications. Denies pain/has not needed to take pain medications, 1-2 formed BM per day, and denies nausea or vomiting. Initial irritation on skin over incision has now cleared.  Patient is scheduled to be seen with General Surgery on 05/24/2023; he is now aware and has address and phone number to contact surgeons.  On exam, surgical scar at appropriate stage of healing without evidence of infection. Plan: -Follow up with General surgery -Continue caring for incision site and monitor for signs of infection

## 2023-05-13 NOTE — Patient Instructions (Addendum)
Thank you, [name] for allowing [place] to provide your care today. Today we discussed you recent surgery and health care maintenance.    Follow up with the general surgeon for reassessment after your hernia surgery  05/24/2023 11:50 AM  Lindell Noe, MD General Surgery  [address] [place] [address] Phone: [phone]   Referral to gastroenterology for a colonoscopy. They will call you to schedule the appointment  My Chart Access: [email or web address]  Please follow-up in: 3 months     We look forward to seeing you next time. Please call our clinic at [phone] if you have any questions or concerns. The best time to call is Monday-Friday from 9am-4pm, but there is someone available 24/7. If after hours or the weekend, call the main hospital number and ask for the Internal Medicine Resident On-Call. If you need medication refills, please notify your pharmacy one week in advance and they will send [place] a request.   Thank you for letting us take part in your care. Wishing you the best!  Morene Crocker, MD 05/13/2023, 10:31 AM [facility] Internal Medicine Resident, PGY-1

## 2023-05-23 NOTE — Progress Notes (Signed)
 Internal Medicine Clinic Attending  Case discussed with the resident physician at the time of the visit.  We reviewed the patient's history, exam, and pertinent patient test results.  I agree with the assessment, diagnosis, and plan of care documented in the resident's note.

## 2023-06-03 ENCOUNTER — Encounter: Payer: Self-pay | Admitting: *Deleted

## 2023-06-13 NOTE — Progress Notes (Deleted)
Cardiology Office Note:    Date:  06/13/2023   ID:  Aaron [name], DOB 1966/09/16, MRN [id number]  PCP:  Rudene Christians, DO  Cardiologist:  Little Ishikawa, MD  Electrophysiologist:  None   Referring MD: No ref. provider found   No chief complaint on file.   History of Present Illness:    Aaron [name] is a 57 y.o. male with a hx of anxiety, hypertension, tobacco use who presents for follow-up.  He was admitted 01/2023 with syncope.  He reported that he was watching TV and started to feel lightheaded.  He remembered he was being woken up by his daughter.  He had an episode of incontinence and was confused for about 5 minutes after the incident.  EMS was called and on arrival he had another syncopal episode.  Per EMS heart rate dropped to the 20s during episode.  He was given atropine with improvement in heart rate to 70s.  Echocardiogram 02/02/2023 showed normal biventricular function, no significant valvular disease.  Brain MRI and EEG were unremarkable.  He was discharged with cardiac monitor.  Zio patch x 13 days 01/2023 showed 2 episodes of NSVT with longest lasting 8 beats, 9 episodes of SVT with longest lasting 9 beats, occasional PACs (2% of beats).  Since last clinic visit,  Since discharge from the hospital, he reports he has been doing okay.  Reports has had some lightheadedness but denies any syncope.  Did have some palpitations while wearing monitor.  He denies any chest pain or dyspnea.  Reports some lower extremity edema after being on feet all day.  He has cut back from 1 to 0.5 packs/day.  Past Medical History:  Diagnosis Date   Anxiety    Hyperlipidemia    Hypertension    Rotator cuff injury 02/18/2013   Right side, untreated   Syncope    Likely Vasovagal    Past Surgical History:  Procedure Laterality Date   HERNIA REPAIR     INGUINAL HERNIA REPAIR Left 04/29/2023   Procedure: OPEN HERNIA REPAIR INGUINAL;  Surgeon: Griselda Miner, MD;  Location: [facility] OR;  Service:  General;  Laterality: Left;   MULTIPLE TOOTH EXTRACTIONS      Current Medications: No outpatient medications have been marked as taking for the 06/14/23 encounter (Appointment) with Little Ishikawa, MD.     Allergies:   Patient has no known allergies.   Social History   Socioeconomic History   Marital status: Widowed    Spouse name: Not on file   Number of children: Not on file   Years of education: Not on file   Highest education level: Not on file  Occupational History   Not on file  Tobacco Use   Smoking status: Every Day    Current packs/day: 0.40    Average packs/day: 0.4 packs/day for 30.0 years (12.0 ttl pk-yrs)    Types: Cigarettes   Smokeless tobacco: Never   Tobacco comments:    5 per day  Vaping Use   Vaping status: Never Used  Substance and Sexual Activity   Alcohol use: Yes    Comment: rarely   Drug use: Yes    Frequency: 1.0 times per week    Types: Marijuana   Sexual activity: Yes    Birth control/protection: None  Other Topics Concern   Not on file  Social History Narrative   Not on file   Social Determinants of Health   Financial Resource Strain: Not on file  Food Insecurity: No Food Insecurity (04/26/2023)   Hunger Vital Sign    Worried About Running Out of Food in the Last Year: Never true    Ran Out of Food in the Last Year: Never true  Transportation Needs: No Transportation Needs (02/02/2023)   PRAPARE - Administrator, Civil Service (Medical): No    Lack of Transportation (Non-Medical): No  Physical Activity: Not on file  Stress: Not on file  Social Connections: Not on file     Family History: The patient's family history includes Lung cancer in his father; Parkinson's disease in his sister.  ROS:   Please see the history of present illness.     All other systems reviewed and are negative.  EKGs/Labs/Other Studies Reviewed:    The following studies were reviewed today:   EKG:   03/08/2023: Normal sinus  rhythm, rate 67, no ST abnormalities, QTc 412  Recent Labs: 02/02/2023: TSH 1.827 04/26/2023: ALT 22 04/30/2023: BUN 12; Creatinine, Ser 0.97; Hemoglobin 12.0; Platelets 220; Potassium 4.3; Sodium 138  Recent Lipid Panel    Component Value Date/Time   CHOL 156 02/15/2023 1548   TRIG 179 (H) 02/15/2023 1548   HDL 43 02/15/2023 1548   CHOLHDL 3.6 02/15/2023 1548   CHOLHDL 4.3 02/19/2013 0635   VLDL 34 02/19/2013 0635   LDLCALC 82 02/15/2023 1548    Physical Exam:    VS:  There were no vitals taken for this visit.    Wt Readings from Last 3 Encounters:  05/13/23 195 lb 12.8 oz (88.8 kg)  04/26/23 190 lb 11.2 oz (86.5 kg)  03/31/23 189 lb 3.2 oz (85.8 kg)     GEN:  Well nourished, well developed in no acute distress HEENT: Normal NECK: No JVD; No carotid bruits LYMPHATICS: No lymphadenopathy CARDIAC: RRR, no murmurs, rubs, gallops RESPIRATORY:  Clear to auscultation without rales, wheezing or rhonchi  ABDOMEN: Soft, non-tender, non-distended MUSCULOSKELETAL:  No edema; No deformity  SKIN: Warm and dry NEUROLOGIC:  Alert and oriented x 3 PSYCHIATRIC:  Normal affect   ASSESSMENT:    No diagnosis found.  PLAN:    Syncope: Patient with new onset of  2 witnessed and 1 unwitnessed episodes of syncope precipitated by prodrome (lightheadedness) and postictal phase (incontinence and confusion).  Per EMS heart rates have dropped to the low 20s and required atropine with response back to the 70s.  Description suggests vasovagal syncope, as describes prodromal symptoms prior to passing out.  Given incontinence and confusion afterwards, seizures also on differential.  Echocardiogram shows no structural abnormalities.  Cardiac telemetry unrevealing.  Zio patch x 13 days 01/2023 showed 2 episodes of NSVT with longest lasting 8 beats, 9 episodes of SVT with longest lasting 9 beats, occasional PACs (2% of beats).  Neuro work-up unremarkable -Discussed [facility] regulations that needs  to be free of syncope x 6 months to drive  Hypertension: Was on benazepril but discontinued during hospitalization 01/2023 due to soft BP.  Currently appears normotensive off antihypertensives***  Tobacco use: Counseled risk of tobacco use and cessation strongly encouraged.  He has started Chantix  Hyperlipidemia: Started on rosuvastatin 10 mg daily 02/2023, LDL 82.  Check calcium score to guide how aggressive to be in lowering cholesterol***  RTC in 3 months  Medication Adjustments/Labs and Tests Ordered: Current medicines are reviewed at length with the patient today.  Concerns regarding medicines are outlined above.  No orders of the defined types were placed in this encounter.  No  orders of the defined types were placed in this encounter.   There are no Patient Instructions on file for this visit.   Signed, Little Ishikawa, MD  06/13/2023 1:12 PM    [facility]

## 2023-06-14 ENCOUNTER — Ambulatory Visit: Payer: BLUE CROSS/BLUE SHIELD | Attending: Cardiology | Admitting: Cardiology

## 2023-06-16 ENCOUNTER — Encounter: Payer: Self-pay | Admitting: Cardiology

## 2024-03-30 ENCOUNTER — Encounter: Payer: Self-pay | Admitting: *Deleted

## 2024-07-10 ENCOUNTER — Emergency Department (HOSPITAL_COMMUNITY): Payer: Self-pay

## 2024-07-10 ENCOUNTER — Emergency Department (HOSPITAL_COMMUNITY)
Admission: EM | Admit: 2024-07-10 | Discharge: 2024-07-10 | Disposition: A | Discharge status: Home / Self Care | Payer: Self-pay | Attending: Emergency Medicine | Admitting: Emergency Medicine

## 2024-07-10 ENCOUNTER — Encounter (HOSPITAL_COMMUNITY): Payer: Self-pay

## 2024-07-10 ENCOUNTER — Other Ambulatory Visit: Payer: Self-pay

## 2024-07-10 DIAGNOSIS — F1290 Cannabis use, unspecified, uncomplicated: Secondary | ICD-10-CM | POA: Insufficient documentation

## 2024-07-10 DIAGNOSIS — R55 Syncope and collapse: Secondary | ICD-10-CM | POA: Insufficient documentation

## 2024-07-10 DIAGNOSIS — R519 Headache, unspecified: Secondary | ICD-10-CM | POA: Insufficient documentation

## 2024-07-10 DIAGNOSIS — R42 Dizziness and giddiness: Secondary | ICD-10-CM | POA: Insufficient documentation

## 2024-07-10 DIAGNOSIS — I10 Essential (primary) hypertension: Secondary | ICD-10-CM | POA: Insufficient documentation

## 2024-07-10 LAB — TROPONIN I (HIGH SENSITIVITY): Troponin I (High Sensitivity): 8 ng/L (ref <18)

## 2024-07-10 LAB — CBC
HCT: 41.9 % (ref 39.0–52.0)
Hemoglobin: 14.4 g/dL (ref 13.0–17.0)
MCH: 32.1 pg (ref 26.0–34.0)
MCHC: 34.4 g/dL (ref 30.0–36.0)
MCV: 93.3 fL (ref 80.0–100.0)
Platelets: 289 K/uL (ref 150–400)
RBC: 4.49 MIL/uL (ref 4.22–5.81)
RDW: 11.9 % (ref 11.5–15.5)
WBC: 10.1 K/uL (ref 4.0–10.5)
nRBC: 0 % (ref 0.0–0.2)

## 2024-07-10 LAB — BASIC METABOLIC PANEL WITH GFR
Anion gap: 11 (ref 5–15)
BUN: 14 mg/dL (ref 6–20)
CO2: 24 mmol/L (ref 22–32)
Calcium: 8.9 mg/dL (ref 8.9–10.3)
Chloride: 104 mmol/L (ref 98–111)
Creatinine, Ser: 1.18 mg/dL (ref 0.61–1.24)
GFR, Estimated: >60 mL/min (ref >60)
Glucose, Bld: 101 mg/dL — ABNORMAL HIGH (ref 70–99)
Potassium: 4 mmol/L (ref 3.5–5.1)
Sodium: 139 mmol/L (ref 135–145)

## 2024-07-10 NOTE — ED Provider Notes (Signed)
 Aaron [name] EMERGENCY DEPARTMENT AT [facility] Provider Note   CSN: [id number] Arrival date & time: 07/10/24  0423     Patient presents with: Hypertension and Bradycardia   Aaron [name] is a 58 y.o. male.   The history is provided by the patient.  Hypertension  Aaron [name] is a 58 y.o. male who presents to the Emergency Department complaining of dizziness. He presents the emergency department for evaluation of an episode of dizziness that occurred earlier when he was at work. He was working and felt lightheaded and he checked his blood pressure and it was 165/74 with a heart rate of 46. He did feel a little bit generally week at that time. He sat down and ate lunch and felt well on rechecked his blood pressure and it was 190/99 with a heart rate of 81. He felt completely normal at that time. He has a history of syncope in the past and had a workup with EEG, MRI and CO patch. He does have a history of hypertension but is not on any medications. He does report mild headache. No fever, chest pain, nausea, numbness, weakness. He does use tobacco. Rare alcohol. Occasional marijuana. He does donate plasma twice weekly, lacerated a week ago.      Prior to Admission medications   Medication Sig Start Date End Date Taking? Authorizing Provider  acetaminophen  (TYLENOL ) 500 MG tablet Take 1-2 tablets (500-1,000 mg total) by mouth every 6 (six) hours as needed for mild pain. 04/30/23   Augustus Almarie RAMAN, PA-C  diphenhydrAMINE  (BENADRYL ) 50 MG tablet Take 1 tablet (50 mg total) by mouth at bedtime as needed for up to 7 days for itching. 04/30/23 05/07/23  Atway, Rayann N, DO  hydrOXYzine  (ATARAX ) 25 MG tablet Take 1 tablet (25 mg total) by mouth 3 (three) times daily as needed. Patient taking differently: Take 25 mg by mouth 3 (three) times daily as needed for anxiety or itching. 02/15/23   Nguyen, Quan, DO  methocarbamol  (ROBAXIN ) 500 MG tablet Take 1 tablet (500 mg total) by mouth every 6  (six) hours as needed for muscle spasms. 04/30/23   Augustus Almarie RAMAN, PA-C  oxyCODONE  (OXY IR/ROXICODONE ) 5 MG immediate release tablet Take 1 tablet (5 mg total) by mouth every 6 (six) hours as needed for moderate pain (not relieved by tylenol  or robaxin ). 04/30/23   Augustus Almarie RAMAN, PA-C  Potassium 99 MG TABS Take 99 mg by mouth daily.    [provider]  rosuvastatin  (CRESTOR ) 10 MG tablet Take 1 tablet (10 mg total) by mouth daily. 05/13/23 05/12/24  Elnora Ip, MD  sertraline  (ZOLOFT ) 25 MG tablet Take 1 tablet (25 mg total) by mouth daily. 02/15/23 02/15/24  Nguyen, Quan, DO    Allergies: Patient has no known allergies.    Review of Systems  All other systems reviewed and are negative.   Updated Vital Signs BP 137/75   Pulse 64   Temp 98.3 F (36.8 C)   Resp (!) 24   Ht 5' 9 (1.753 m)   Wt 84.8 kg   SpO2 94%   BMI 27.62 kg/m   Physical Exam Vitals and nursing note reviewed.  Constitutional:      Appearance: He is well-developed.  HENT:     Head: Normocephalic and atraumatic.  Cardiovascular:     Rate and Rhythm: Normal rate and regular rhythm.     Heart sounds: No murmur heard. Pulmonary:     Effort: Pulmonary effort is  normal. No respiratory distress.     Breath sounds: Normal breath sounds.  Abdominal:     Palpations: Abdomen is soft.     Tenderness: There is no abdominal tenderness. There is no guarding or rebound.  Musculoskeletal:        General: No tenderness.  Skin:    General: Skin is warm and dry.  Neurological:     Mental Status: He is alert and oriented to person, place, and time.     Comments: 5/5 strength in all four extremities with sensation to light touch intact in all four extremities no asymmetry of facial movements.  Visual fields grossly intact.    Psychiatric:        Behavior: Behavior normal.     (all labs ordered are listed, but only abnormal results are displayed) Labs Reviewed  BASIC METABOLIC PANEL WITH GFR -  Abnormal; Notable for the following components:      Result Value   Glucose, Bld 101 (*)    All other components within normal limits  CBC  TROPONIN I (HIGH SENSITIVITY)  TROPONIN I (HIGH SENSITIVITY)    EKG: EKG Interpretation Date/Time:  Tuesday July 10 2024 05:31:43 EDT Ventricular Rate:  65 PR Interval:  138 QRS Duration:  173 QT Interval:  383 QTC Calculation: 399 R Axis:   49  Text Interpretation: Sinus rhythm Nonspecific intraventricular conduction delay Confirmed by Griselda Norris [phone]) on 07/10/2024 5:41:20 AM  Radiology: DG Chest 2 View Result Date: 07/10/2024 CLINICAL DATA:  Hypertension and bradycardia. EXAM: CHEST - 2 VIEW COMPARISON:  02/02/2023 FINDINGS: The lungs are clear without focal pneumonia, edema, pneumothorax or pleural effusion. The cardiopericardial silhouette is within normal limits for size. No acute bony abnormality. IMPRESSION: No active cardiopulmonary disease. Electronically Signed   By: Camellia Candle M.D.   On: 07/10/2024 05:05     Procedures   Medications Ordered in the ED - No data to display                                  Medical Decision Making Amount and/or Complexity of Data Reviewed Labs: ordered. Radiology: ordered.   Patient here for evaluation of abnormal vital signs that he took when he was working at [employer]. He did feel lightheaded when his heart rate was in the 40s. Overall his symptoms are completely resolved. EKG is not ischemic. Patient was sinus rhythm and sinus arrhythmia. He was observed on telemetry with no dangerous arrhythmias. Current picture is not consistent with ACS. No significant electrolyte abnormalities. Patient did have preceding symptoms. Feel he is stable for discharge with outpatient follow-up and return precautions.     Final diagnoses:  Near syncope    ED Discharge Orders     None          Griselda Norris, MD 07/10/24 [phone]

## 2024-07-10 NOTE — ED Triage Notes (Signed)
 Pt arrived from work POV c/o bradycardia and HTN. Pt states while at work he became very sluggish and felt generally bad. Took his bp and pulse. Pulse was in the 40's and his bp was 190/99.
